# Patient Record
Sex: Female | Born: 1995 | Race: White | Hispanic: No | Marital: Single | Smoking: Current some day smoker
Health system: Southern US, Community
[De-identification: ages and names within clinical notes are randomized; demographics above are authoritative.]

## PROBLEM LIST (undated history)

## (undated) ENCOUNTER — Inpatient Hospital Stay (HOSPITAL_COMMUNITY): Payer: Self-pay

## (undated) ENCOUNTER — Inpatient Hospital Stay: Admission: EM | Payer: Self-pay | Source: Home / Self Care

## (undated) DIAGNOSIS — K8020 Calculus of gallbladder without cholecystitis without obstruction: Secondary | ICD-10-CM

## (undated) DIAGNOSIS — G8929 Other chronic pain: Secondary | ICD-10-CM

## (undated) DIAGNOSIS — M549 Dorsalgia, unspecified: Secondary | ICD-10-CM

## (undated) DIAGNOSIS — F419 Anxiety disorder, unspecified: Secondary | ICD-10-CM

## (undated) DIAGNOSIS — M5136 Other intervertebral disc degeneration, lumbar region: Secondary | ICD-10-CM

## (undated) DIAGNOSIS — M5430 Sciatica, unspecified side: Secondary | ICD-10-CM

## (undated) DIAGNOSIS — M51369 Other intervertebral disc degeneration, lumbar region without mention of lumbar back pain or lower extremity pain: Secondary | ICD-10-CM

## (undated) HISTORY — PX: CHOLECYSTECTOMY: SHX55

## (undated) HISTORY — PX: HERNIA REPAIR: SHX51

---
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
## 2013-04-14 ENCOUNTER — Encounter (HOSPITAL_BASED_OUTPATIENT_CLINIC_OR_DEPARTMENT_OTHER): Payer: Self-pay | Admitting: Emergency Medicine

## 2013-04-14 DIAGNOSIS — O9989 Other specified diseases and conditions complicating pregnancy, childbirth and the puerperium: Secondary | ICD-10-CM | POA: Insufficient documentation

## 2013-04-14 DIAGNOSIS — R110 Nausea: Secondary | ICD-10-CM | POA: Insufficient documentation

## 2013-04-14 DIAGNOSIS — O2390 Unspecified genitourinary tract infection in pregnancy, unspecified trimester: Secondary | ICD-10-CM | POA: Insufficient documentation

## 2013-04-14 DIAGNOSIS — Z9889 Other specified postprocedural states: Secondary | ICD-10-CM | POA: Insufficient documentation

## 2013-04-14 DIAGNOSIS — R42 Dizziness and giddiness: Secondary | ICD-10-CM | POA: Insufficient documentation

## 2013-04-14 DIAGNOSIS — N72 Inflammatory disease of cervix uteri: Secondary | ICD-10-CM | POA: Insufficient documentation

## 2013-04-14 DIAGNOSIS — N390 Urinary tract infection, site not specified: Secondary | ICD-10-CM | POA: Insufficient documentation

## 2013-04-14 DIAGNOSIS — Z8659 Personal history of other mental and behavioral disorders: Secondary | ICD-10-CM | POA: Insufficient documentation

## 2013-04-14 NOTE — ED Notes (Signed)
[redacted]weeks pregnant  Became dizzy and "collapsed"  Did not lose consciousness

## 2013-04-15 ENCOUNTER — Other Ambulatory Visit (HOSPITAL_BASED_OUTPATIENT_CLINIC_OR_DEPARTMENT_OTHER): Payer: Self-pay | Admitting: Emergency Medicine

## 2013-04-15 ENCOUNTER — Telehealth (HOSPITAL_BASED_OUTPATIENT_CLINIC_OR_DEPARTMENT_OTHER): Payer: Self-pay

## 2013-04-15 ENCOUNTER — Ambulatory Visit (HOSPITAL_BASED_OUTPATIENT_CLINIC_OR_DEPARTMENT_OTHER)
Admission: RE | Admit: 2013-04-15 | Discharge: 2013-04-15 | Disposition: A | Discharge status: Home / Self Care | Payer: Commercial Managed Care - PPO | Source: Ambulatory Visit | Attending: Emergency Medicine | Admitting: Emergency Medicine

## 2013-04-15 ENCOUNTER — Emergency Department (HOSPITAL_BASED_OUTPATIENT_CLINIC_OR_DEPARTMENT_OTHER)
Admission: EM | Admit: 2013-04-15 | Discharge: 2013-04-15 | Disposition: A | Discharge status: Home / Self Care | Payer: Medicaid Other | Attending: Emergency Medicine | Admitting: Emergency Medicine

## 2013-04-15 ENCOUNTER — Ambulatory Visit (HOSPITAL_BASED_OUTPATIENT_CLINIC_OR_DEPARTMENT_OTHER)
Admit: 2013-04-15 | Discharge: 2013-04-15 | Disposition: A | Discharge status: Home / Self Care | Payer: Commercial Managed Care - PPO | Attending: Emergency Medicine | Admitting: Emergency Medicine

## 2013-04-15 DIAGNOSIS — R109 Unspecified abdominal pain: Secondary | ICD-10-CM | POA: Insufficient documentation

## 2013-04-15 DIAGNOSIS — O9989 Other specified diseases and conditions complicating pregnancy, childbirth and the puerperium: Secondary | ICD-10-CM | POA: Insufficient documentation

## 2013-04-15 DIAGNOSIS — R42 Dizziness and giddiness: Secondary | ICD-10-CM | POA: Diagnosis not present

## 2013-04-15 DIAGNOSIS — O2341 Unspecified infection of urinary tract in pregnancy, first trimester: Secondary | ICD-10-CM

## 2013-04-15 DIAGNOSIS — R102 Pelvic and perineal pain: Secondary | ICD-10-CM

## 2013-04-15 DIAGNOSIS — R52 Pain, unspecified: Secondary | ICD-10-CM

## 2013-04-15 DIAGNOSIS — N72 Inflammatory disease of cervix uteri: Secondary | ICD-10-CM

## 2013-04-15 DIAGNOSIS — N949 Unspecified condition associated with female genital organs and menstrual cycle: Secondary | ICD-10-CM | POA: Diagnosis present

## 2013-04-15 LAB — URINE MICROSCOPIC-ADD ON

## 2013-04-15 LAB — ABO/RH: ABO/RH(D): A POS

## 2013-04-15 LAB — URINALYSIS, ROUTINE W REFLEX MICROSCOPIC
BILIRUBIN URINE: NEGATIVE
Glucose, UA: NEGATIVE mg/dL
KETONES UR: NEGATIVE mg/dL
NITRITE: NEGATIVE
PH: 6 (ref 5.0–8.0)
PROTEIN: NEGATIVE mg/dL
Specific Gravity, Urine: 1.017 (ref 1.005–1.030)
UROBILINOGEN UA: 0.2 mg/dL (ref 0.0–1.0)

## 2013-04-15 LAB — WET PREP, GENITAL
Trich, Wet Prep: NONE SEEN
Yeast Wet Prep HPF POC: NONE SEEN

## 2013-04-15 LAB — GC/CHLAMYDIA PROBE AMP
CT Probe RNA: NEGATIVE
GC PROBE AMP APTIMA: NEGATIVE

## 2013-04-15 LAB — HIV ANTIBODY (ROUTINE TESTING W REFLEX): HIV: NONREACTIVE

## 2013-04-15 LAB — HCG, QUANTITATIVE, PREGNANCY: hCG, Beta Chain, Quant, S: 102318 m[IU]/mL — ABNORMAL HIGH (ref <5)

## 2013-04-15 LAB — PREGNANCY, URINE: PREG TEST UR: POSITIVE — AB

## 2013-04-15 MED ORDER — CEFTRIAXONE SODIUM 250 MG IJ SOLR
250.0000 mg | Freq: Once | INTRAMUSCULAR | Status: AC
  Administered 2013-04-15: 250 mg via INTRAMUSCULAR
  Filled 2013-04-15: qty 250

## 2013-04-15 MED ORDER — NITROFURANTOIN MONOHYD MACRO 100 MG PO CAPS: 100.0000 mg | ORAL_CAPSULE | Freq: Two times a day (BID) | ORAL | Status: DC

## 2013-04-15 MED ORDER — NITROFURANTOIN MONOHYD MACRO 100 MG PO CAPS
100.0000 mg | ORAL_CAPSULE | Freq: Once | ORAL | Status: AC
  Administered 2013-04-15: 100 mg via ORAL
  Filled 2013-04-15: qty 1

## 2013-04-15 MED ORDER — AZITHROMYCIN 1 G PO PACK
1.0000 g | PACK | Freq: Once | ORAL | Status: AC
  Administered 2013-04-15: 1 g via ORAL
  Filled 2013-04-15: qty 1

## 2013-04-15 NOTE — ED Provider Notes (Signed)
CSN: [id number]     Arrival date & time 04/14/13  2335 History   First MD Initiated Contact with Patient 04/15/13 0324     Chief Complaint  Patient presents with  . Dizziness     (Consider location/radiation/quality/duration/timing/severity/associated sxs/prior Treatment) HPI This is a 18 year old female who is about [redacted] weeks pregnant. Yesterday evening about 10 PM she became nauseated, lightheaded and near syncopal. She became weak and slid to the ground. She has subsequently been having suprapubic discomfort which she describes as a cramping as well as low back pain. She has had some vaginal spotting as well as a tearing right suprapubic pain for the past week. The suprapubic discomfort is worse with movement or certain positions. She denies dysuria. She denies fever or chills. She has not been vomiting.  Past Medical History  Diagnosis Date  . Anxiety   . Anxiety    Past Surgical History  Procedure Laterality Date  . Hernia repair    . I&d extremity Right 12/18/2012    Procedure: IRRIGATION AND DEBRIDEMENT RIGHT RING FINGER;  Surgeon: Tami Ribas, MD;  Location: [facility] OR;  Service: Orthopedics;  Laterality: Right;   No family history on file. History  Substance Use Topics  . Smoking status: Never Smoker   . Smokeless tobacco: Not on file  . Alcohol Use: No   OB History   Grav Para Term Preterm Abortions TAB SAB Ect Mult Living                 Review of Systems  All other systems reviewed and are negative.    Allergies  Clindamycin/lincomycin and Pb-hyoscy-atropine-scopolamine  Home Medications  No current outpatient prescriptions on file. BP 130/82  Pulse 89  Temp(Src) 98 F (36.7 C) (Oral)  Resp 18  Ht 5\' 2"  (1.575 m)  Wt 170 lb (77.111 kg)  BMI 31.09 kg/m2  SpO2 99%  Physical Exam General: Well-developed, well-nourished female in no acute distress; appearance consistent with age of record HENT: normocephalic; atraumatic Eyes: pupils equal, round and  reactive to light; extraocular muscles intact Neck: supple Heart: regular rate and rhythm Lungs: clear to auscultation bilaterally Abdomen: soft; nondistended; diffuse tenderness; no masses or hepatosplenomegaly; bowel sounds present GU: Normal external genitalia; purulent discharge per cervical os; no vaginal bleeding; cervical motion tenderness; right adnexal tenderness Back: Low back tenderness Extremities: No deformity; full range of motion; pulses normal Neurologic: Awake, alert and oriented; motor function intact in all extremities and symmetric; no facial droop Skin: Warm and dry Psychiatric: Flat affect    ED Course  Procedures (including critical care time)   MDM   Nursing notes and vitals signs, including pulse oximetry, reviewed.  Summary of this visit's results, reviewed by myself:  Labs:  Results for orders placed during the hospital encounter of 04/15/13 (from the past 24 hour(s))  PREGNANCY, URINE     Status: Abnormal   Collection Time    04/15/13  1:05 AM      Result Value Ref Range   Preg Test, Ur POSITIVE (*) NEGATIVE  URINALYSIS, ROUTINE W REFLEX MICROSCOPIC     Status: Abnormal   Collection Time    04/15/13  1:05 AM      Result Value Ref Range   Color, Urine YELLOW  YELLOW   APPearance CLOUDY (*) CLEAR   Specific Gravity, Urine 1.017  1.005 - 1.030   pH 6.0  5.0 - 8.0   Glucose, UA NEGATIVE  NEGATIVE mg/dL   Hgb urine dipstick  MODERATE (*) NEGATIVE   Bilirubin Urine NEGATIVE  NEGATIVE   Ketones, ur NEGATIVE  NEGATIVE mg/dL   Protein, ur NEGATIVE  NEGATIVE mg/dL   Urobilinogen, UA 0.2  0.0 - 1.0 mg/dL   Nitrite NEGATIVE  NEGATIVE   Leukocytes, UA MODERATE (*) NEGATIVE  URINE MICROSCOPIC-ADD ON     Status: Abnormal   Collection Time    04/15/13  1:05 AM      Result Value Ref Range   Squamous Epithelial / LPF MANY (*) RARE   WBC, UA 7-10  <3 WBC/hpf   RBC / HPF 7-10  <3 RBC/hpf   Bacteria, UA MANY (*) RARE   Urine-Other MUCOUS PRESENT    WET  PREP, GENITAL     Status: Abnormal   Collection Time    04/15/13  3:47 AM      Result Value Ref Range   Yeast Wet Prep HPF POC NONE SEEN  NONE SEEN   Trich, Wet Prep NONE SEEN  NONE SEEN   Clue Cells Wet Prep HPF POC FEW (*) NONE SEEN   WBC, Wet Prep HPF POC TOO NUMEROUS TO COUNT (*) NONE SEEN   4:11 AM Ahead and treat for gonorrhea and Chlamydia given purulent cervical discharge. No evidence of trichomoniasis on urinalysis or wet prep. Will also treat for a urinary tract infection given a urinary findings. We will have her return later today for a pelvic ultrasound. She has followup scheduled with an OB/GYN in April. She was advised that [facility]related emergencies.    Hanley SeamenJohn L Mayukha Symmonds, MD 04/15/13 [phone]

## 2013-04-16 LAB — URINE CULTURE: Colony Count: 30000

## 2013-07-15 ENCOUNTER — Encounter (HOSPITAL_COMMUNITY): Payer: Self-pay | Admitting: *Deleted

## 2013-07-15 ENCOUNTER — Inpatient Hospital Stay (HOSPITAL_COMMUNITY)
Admission: AD | Admit: 2013-07-15 | Discharge: 2013-07-15 | Disposition: A | Discharge status: Home / Self Care | Payer: Commercial Managed Care - PPO | Source: Ambulatory Visit | Attending: Obstetrics & Gynecology | Admitting: Obstetrics & Gynecology

## 2013-07-15 DIAGNOSIS — R079 Chest pain, unspecified: Secondary | ICD-10-CM | POA: Diagnosis present

## 2013-07-15 DIAGNOSIS — O99340 Other mental disorders complicating pregnancy, unspecified trimester: Secondary | ICD-10-CM | POA: Insufficient documentation

## 2013-07-15 DIAGNOSIS — K219 Gastro-esophageal reflux disease without esophagitis: Secondary | ICD-10-CM | POA: Insufficient documentation

## 2013-07-15 DIAGNOSIS — F411 Generalized anxiety disorder: Secondary | ICD-10-CM | POA: Insufficient documentation

## 2013-07-15 DIAGNOSIS — O26892 Other specified pregnancy related conditions, second trimester: Secondary | ICD-10-CM

## 2013-07-15 DIAGNOSIS — R12 Heartburn: Secondary | ICD-10-CM

## 2013-07-15 DIAGNOSIS — O9989 Other specified diseases and conditions complicating pregnancy, childbirth and the puerperium: Secondary | ICD-10-CM

## 2013-07-15 MED ORDER — ALUM & MAG HYDROXIDE-SIMETH 200-200-20 MG/5ML PO SUSP
30.0000 mL | Freq: Once | ORAL | Status: AC
  Administered 2013-07-15: 30 mL via ORAL
  Filled 2013-07-15: qty 30

## 2013-07-15 MED ORDER — GI COCKTAIL ~~LOC~~: 30.0000 mL | Freq: Once | ORAL | Status: DC

## 2013-07-15 NOTE — MAU Note (Signed)
PT  SAY SHE STARTED HURTING AT  ON BOTH SIDES  OF CHEST  THEN RADIATES   TO MIDDLE OF ABD  THEN TO HER BACK.    0130-  SHE TOOK PEPCID.   NO N/V/ D.  IT ALSO HAPPENED  WHEN SHE WAS 14 WEEKS - GAVE PEPCID .    SAYS SHE HAS AN ANXIETY  DISORDER.  GET[facility]-  LAST SEEN -  LAST Thursday-  FOR [place].-   PLAN TO DELIVER THERE.

## 2013-07-15 NOTE — MAU Provider Note (Signed)
None     Chief Complaint: Anxiety/chest pain   Rachel Calhoun is  18 y.o. G1P0 at 6945w1d presents complaining of having an episode at home where she began feeling anxious and her chest started hurting/burning.  She took a pepcid and has "calmed down" and feels better now, although her chest still burns some.  [facility], but "doesn't really like them."  Ms Rachel Calhoun has suffered from anxiety and reflux since middle school.  According to her mother, she has had extensive workup (EKG's, stress test) to dx chest pain.  [name] drew the line at an EGD (didn't like the idea of a scope), and has been treating it as reflux ever since.  She was also started on Zoloft a few weeks before she found out she was pregnant for her anxiety, and stopped taking it "because she heard on TV that it was recalled."    Obstetrical/Gynecological History: OB History   Grav Para Term Preterm Abortions TAB SAB Ect Mult Living   1              Past Medical History: Past Medical History  Diagnosis Date  . Anxiety   . Anxiety     Past Surgical History: Past Surgical History  Procedure Laterality Date  . Hernia repair    . I&d extremity Right 12/18/2012    Procedure: IRRIGATION AND DEBRIDEMENT RIGHT RING FINGER;  Surgeon: Tami RibasKevin R Kuzma, MD;  Location: [facility] OR;  Service: Orthopedics;  Laterality: Right;    Family History: History reviewed. No pertinent family history.  Social History: History  Substance Use Topics  . Smoking status: Never Smoker   . Smokeless tobacco: Not on file  . Alcohol Use: No    Allergies:  Allergies  Allergen Reactions  . Clindamycin/Lincomycin     Uncontrollable fever  . Pb-Hyoscy-Atropine-Scopolamine     Uncontrollable fever     Meds:  No prescriptions prior to admission    Review of Systems   Constitutional: Negative for fever and chills Eyes: Negative for visual disturbances Respiratory: Negative for shortness of breath, dyspnea Cardiovascular:  Negative for palpitations  Gastrointestinal: Negative for vomiting, diarrhea and constipation Genitourinary: Negative for dysuria and urgency Musculoskeletal: Negative for joint pain, myalgias.  POSITIVE for upper back pain Neurological: Negative for dizziness and headaches    Physical Exam  Blood pressure 116/68, pulse 95, temperature 97.9 F (36.6 C), temperature source Oral, resp. rate 20, last menstrual period 11/19/2012, SpO2 99.00%. GENERAL: Well-developed, well-nourished female in no acute distress.  LUNGS: Clear to auscultation bilaterally. HEART: Regular rate and rhythm. ABDOMEN: Soft, nontender, nondistended, gravid.  EXTREMITIES: Nontender, no edema, 2+ distal pulses. DTR's 2+    Labs: No results found for this or any previous visit (from the past 24 hour(s)). EKG showed normal sinus rhythm  Assessment: Rachel Calhoun is  18 y.o. G1P0 at 5645w1d presents with anxiety/GERD, improved.  Plan: Offered viscous lidocaine (is allergic to donnatal in the GI cocktail) and declined.  Informed that zoloft had not been recalled and encouraged to restart.  Also encouraged to seek therapy/treatment for her anxiety. [facility] or transfer care  CRESENZO-DISHMAN,Rachel Calhoun 6/18/20159:12 PM

## 2013-07-15 NOTE — Discharge Instructions (Signed)
Gastroesophageal Reflux Disease, Adult  Gastroesophageal reflux disease (GERD) happens when acid from your stomach flows up into the esophagus. When acid comes in contact with the esophagus, the acid causes soreness (inflammation) in the esophagus. Over time, GERD may create small holes (ulcers) in the lining of the esophagus.  CAUSES   · Increased body weight. This puts pressure on the stomach, making acid rise from the stomach into the esophagus.  · Smoking. This increases acid production in the stomach.  · Drinking alcohol. This causes decreased pressure in the lower esophageal sphincter (valve or ring of muscle between the esophagus and stomach), allowing acid from the stomach into the esophagus.  · Late evening meals and a full stomach. This increases pressure and acid production in the stomach.  · A malformed lower esophageal sphincter.  Sometimes, no cause is found.  SYMPTOMS   · Burning pain in the lower part of the mid-chest behind the breastbone and in the mid-stomach area. This may occur twice a week or more often.  · Trouble swallowing.  · Sore throat.  · Dry cough.  · Asthma-like symptoms including chest tightness, shortness of breath, or wheezing.  DIAGNOSIS   Your caregiver may be able to diagnose GERD based on your symptoms. In some cases, X-rays and other tests may be done to check for complications or to check the condition of your stomach and esophagus.  TREATMENT   Your caregiver may recommend over-the-counter or prescription medicines to help decrease acid production. Ask your caregiver before starting or adding any new medicines.   HOME CARE INSTRUCTIONS   · Change the factors that you can control. Ask your caregiver for guidance concerning weight loss, quitting smoking, and alcohol consumption.  · Avoid foods and drinks that make your symptoms worse, such as:  · Caffeine or alcoholic drinks.  · Chocolate.  · Peppermint or mint flavorings.  · Garlic and onions.  · Spicy foods.  · Citrus fruits,  such as oranges, lemons, or limes.  · Tomato-based foods such as sauce, chili, salsa, and pizza.  · Fried and fatty foods.  · Avoid lying down for the 3 hours prior to your bedtime or prior to taking a nap.  · Eat small, frequent meals instead of large meals.  · Wear loose-fitting clothing. Do not wear anything tight around your waist that causes pressure on your stomach.  · Raise the head of your bed 6 to 8 inches with wood blocks to help you sleep. Extra pillows will not help.  · Only take over-the-counter or prescription medicines for pain, discomfort, or fever as directed by your caregiver.  · Do not take aspirin, ibuprofen, or other nonsteroidal anti-inflammatory drugs (NSAIDs).  SEEK IMMEDIATE MEDICAL CARE IF:   · You have pain in your arms, neck, jaw, teeth, or back.  · Your pain increases or changes in intensity or duration.  · You develop nausea, vomiting, or sweating (diaphoresis).  · You develop shortness of breath, or you faint.  · Your vomit is green, yellow, black, or looks like coffee grounds or blood.  · Your stool is red, bloody, or black.  These symptoms could be signs of other problems, such as heart disease, gastric bleeding, or esophageal bleeding.  MAKE SURE YOU:   · Understand these instructions.  · Will watch your condition.  · Will get help right away if you are not doing well or get worse.  Document Released: 10/30/2004 Document Revised: 04/14/2011 Document Reviewed: 08/09/2010  [employer]® Patient   Information ©2014 [employer].

## 2013-07-25 NOTE — MAU Provider Note (Signed)
Attestation of Attending Supervision of Advanced Practitioner (CNM/NP): Evaluation and management procedures were performed by the Advanced Practitioner under my supervision and collaboration. I have reviewed the Advanced Practitioner's note and chart, and I agree with the management and plan.  Rachel Wandrey H. 2:51 PM

## 2013-09-15 ENCOUNTER — Inpatient Hospital Stay (HOSPITAL_COMMUNITY)
Admission: AD | Admit: 2013-09-15 | Discharge: 2013-09-15 | Disposition: A | Discharge status: Home / Self Care | Payer: Medicaid Other | Source: Ambulatory Visit | Attending: Obstetrics & Gynecology | Admitting: Obstetrics & Gynecology

## 2013-09-15 ENCOUNTER — Encounter (HOSPITAL_COMMUNITY): Payer: Self-pay | Admitting: *Deleted

## 2013-09-15 DIAGNOSIS — K8050 Calculus of bile duct without cholangitis or cholecystitis without obstruction: Secondary | ICD-10-CM

## 2013-09-15 DIAGNOSIS — K8020 Calculus of gallbladder without cholecystitis without obstruction: Secondary | ICD-10-CM | POA: Insufficient documentation

## 2013-09-15 DIAGNOSIS — O9989 Other specified diseases and conditions complicating pregnancy, childbirth and the puerperium: Secondary | ICD-10-CM | POA: Insufficient documentation

## 2013-09-15 DIAGNOSIS — R109 Unspecified abdominal pain: Secondary | ICD-10-CM | POA: Diagnosis present

## 2013-09-15 HISTORY — DX: Calculus of gallbladder without cholecystitis without obstruction: K80.20

## 2013-09-15 LAB — URINALYSIS, ROUTINE W REFLEX MICROSCOPIC
Bilirubin Urine: NEGATIVE
GLUCOSE, UA: NEGATIVE mg/dL
HGB URINE DIPSTICK: NEGATIVE
Ketones, ur: 15 mg/dL — AB
Nitrite: NEGATIVE
PROTEIN: NEGATIVE mg/dL
Specific Gravity, Urine: 1.015 (ref 1.005–1.030)
UROBILINOGEN UA: 0.2 mg/dL (ref 0.0–1.0)
pH: 6.5 (ref 5.0–8.0)

## 2013-09-15 LAB — COMPREHENSIVE METABOLIC PANEL
ALT: 12 U/L (ref 0–35)
ANION GAP: 12 (ref 5–15)
AST: 11 U/L (ref 0–37)
Albumin: 2.9 g/dL — ABNORMAL LOW (ref 3.5–5.2)
Alkaline Phosphatase: 97 U/L (ref 39–117)
BUN: 7 mg/dL (ref 6–23)
CO2: 22 mEq/L (ref 19–32)
CREATININE: 0.46 mg/dL — AB (ref 0.50–1.10)
Calcium: 8.7 mg/dL (ref 8.4–10.5)
Chloride: 103 mEq/L (ref 96–112)
GFR calc Af Amer: >90 mL/min (ref >90)
Glucose, Bld: 84 mg/dL (ref 70–99)
Potassium: 3.8 mEq/L (ref 3.7–5.3)
SODIUM: 137 meq/L (ref 137–147)
TOTAL PROTEIN: 6.1 g/dL (ref 6.0–8.3)
Total Bilirubin: 0.6 mg/dL (ref 0.3–1.2)

## 2013-09-15 LAB — CBC WITH DIFFERENTIAL/PLATELET
Basophils Absolute: 0 10*3/uL (ref 0.0–0.1)
Basophils Relative: 0 % (ref 0–1)
EOS ABS: 0.1 10*3/uL (ref 0.0–0.7)
Eosinophils Relative: 1 % (ref 0–5)
HEMATOCRIT: 32 % — AB (ref 36.0–46.0)
Hemoglobin: 10.8 g/dL — ABNORMAL LOW (ref 12.0–15.0)
Lymphocytes Relative: 21 % (ref 12–46)
Lymphs Abs: 2.3 10*3/uL (ref 0.7–4.0)
MCH: 27.8 pg (ref 26.0–34.0)
MCHC: 33.8 g/dL (ref 30.0–36.0)
MCV: 82.5 fL (ref 78.0–100.0)
MONO ABS: 0.9 10*3/uL (ref 0.1–1.0)
Monocytes Relative: 8 % (ref 3–12)
Neutro Abs: 7.8 10*3/uL — ABNORMAL HIGH (ref 1.7–7.7)
Neutrophils Relative %: 70 % (ref 43–77)
PLATELETS: 202 10*3/uL (ref 150–400)
RBC: 3.88 MIL/uL (ref 3.87–5.11)
RDW: 13.2 % (ref 11.5–15.5)
WBC: 11.2 10*3/uL — ABNORMAL HIGH (ref 4.0–10.5)

## 2013-09-15 LAB — URINE MICROSCOPIC-ADD ON

## 2013-09-15 MED ORDER — CYCLOBENZAPRINE HCL 5 MG PO TABS: 5.0000 mg | ORAL_TABLET | Freq: Three times a day (TID) | ORAL | Status: DC | PRN

## 2013-09-15 MED ORDER — ONDANSETRON HCL 4 MG PO TABS
4.0000 mg | ORAL_TABLET | Freq: Once | ORAL | Status: AC
  Administered 2013-09-15: 4 mg via ORAL
  Filled 2013-09-15: qty 1

## 2013-09-15 MED ORDER — HYDROMORPHONE HCL 2 MG PO TABS
2.0000 mg | ORAL_TABLET | Freq: Once | ORAL | Status: AC
  Administered 2013-09-15: 2 mg via ORAL
  Filled 2013-09-15: qty 1

## 2013-09-15 NOTE — MAU Note (Signed)
Pt reports she just found out she has gallstones and has been seen 2  Times resently, for the last 2 hours has been having rt side pain, nausea.

## 2013-09-15 NOTE — Discharge Instructions (Signed)
Abdominal Pain °Many things can cause abdominal pain. Usually, abdominal pain is not caused by a disease and will improve without treatment. It can often be observed and treated at home. Your health care provider will do a physical exam and possibly order blood tests and X-rays to help determine the seriousness of your pain. However, in many cases, more time must pass before a clear cause of the pain can be found. Before that point, your health care provider may not know if you need more testing or further treatment. °HOME CARE INSTRUCTIONS  °Monitor your abdominal pain for any changes. The following actions may help to alleviate any discomfort you are experiencing: °· Only take over-the-counter or prescription medicines as directed by your health care provider. °· Do not take laxatives unless directed to do so by your health care provider. °· Try a clear liquid diet (broth, tea, or water) as directed by your health care provider. Slowly move to a bland diet as tolerated. °SEEK MEDICAL CARE IF: °· You have unexplained abdominal pain. °· You have abdominal pain associated with nausea or diarrhea. °· You have pain when you urinate or have a bowel movement. °· You experience abdominal pain that wakes you in the night. °· You have abdominal pain that is worsened or improved by eating food. °· You have abdominal pain that is worsened with eating fatty foods. °· You have a fever. °SEEK IMMEDIATE MEDICAL CARE IF:  °· Your pain does not go away within 2 hours. °· You keep throwing up (vomiting). °· Your pain is felt only in portions of the abdomen, such as the right side or the left lower portion of the abdomen. °· You pass bloody or black tarry stools. °MAKE SURE YOU: °· Understand these instructions.   °· Will watch your condition.   °· Will get help right away if you are not doing well or get worse.   °Document Released: 10/30/2004 Document Revised: 01/25/2013 Document Reviewed: 09/29/2012 °[employer]® Patient Information  ©2015 [employer]. This information is not intended to replace advice given to you by your health care provider. Make sure you discuss any questions you have with your health care provider. ° °Biliary Colic  °Biliary colic is a steady or irregular pain in the upper abdomen. It is usually under the right side of the rib cage. It happens when gallstones interfere with the normal flow of bile from the gallbladder. Bile is a liquid that helps to digest fats. Bile is made in the liver and stored in the gallbladder. When you eat a meal, bile passes from the gallbladder through the cystic duct and the common bile duct into the small intestine. There, it mixes with partially digested food. If a gallstone blocks either of these ducts, the normal flow of bile is blocked. The muscle cells in the bile duct contract forcefully to try to move the stone. This causes the pain of biliary colic.  °SYMPTOMS  °· A person with biliary colic usually complains of pain in the upper abdomen. This pain can be: °¨ In the center of the upper abdomen just below the breastbone. °¨ In the upper-right part of the abdomen, near the gallbladder and liver. °¨ Spread back toward the right shoulder blade. °· Nausea and vomiting. °· The pain usually occurs after eating. °· Biliary colic is usually triggered by the digestive system's demand for bile. The demand for bile is high after fatty meals. Symptoms can also occur when a person who has been fasting suddenly eats a very   large meal. Most episodes of biliary colic pass after 1 to 5 hours. After the most intense pain passes, your abdomen may continue to ache mildly for about 24 hours. DIAGNOSIS  After you describe your symptoms, your caregiver will perform a physical exam. He or she will pay attention to the upper right portion of your belly (abdomen). This is the area of your liver and gallbladder. An ultrasound will help your caregiver look for gallstones. Specialized scans of the gallbladder may  also be done. Blood tests may be done, especially if you have fever or if your pain persists. PREVENTION  Biliary colic can be prevented by controlling the risk factors for gallstones. Some of these risk factors, such as heredity, increasing age, and pregnancy are a normal part of life. Obesity and a high-fat diet are risk factors you can change through a healthy lifestyle. Women going through menopause who take hormone replacement therapy (estrogen) are also more likely to develop biliary colic. TREATMENT   Pain medication may be prescribed.  You may be encouraged to eat a fat-free diet.  If the first episode of biliary colic is severe, or episodes of colic keep retuning, surgery to remove the gallbladder (cholecystectomy) is usually recommended. This procedure can be done through small incisions using an instrument called a laparoscope. The procedure often requires a brief stay in the hospital. Some people can leave the hospital the same day. It is the most widely used treatment in people troubled by painful gallstones. It is effective and safe, with no complications in more than 90% of cases.  If surgery cannot be done, medication that dissolves gallstones may be used. This medication is expensive and can take months or years to work. Only small stones will dissolve.  Rarely, medication to dissolve gallstones is combined with a procedure called shock-wave lithotripsy. This procedure uses carefully aimed shock waves to break up gallstones. In many people treated with this procedure, gallstones form again within a few years. PROGNOSIS  If gallstones block your cystic duct or common bile duct, you are at risk for repeated episodes of biliary colic. There is also a 25% chance that you will develop a gallbladder infection(acute cholecystitis), or some other complication of gallstones within 10 to 20 years. If you have surgery, schedule it at a time that is convenient for you and at a time when you are  not sick. HOME CARE INSTRUCTIONS   Drink plenty of clear fluids.  Avoid fatty, greasy or fried foods, or any foods that make your pain worse.  Take medications as directed. SEEK MEDICAL CARE IF:   You develop a fever over 100.5 F (38.1 C).  Your pain gets worse over time.  You develop nausea that prevents you from eating and drinking.  You develop vomiting. SEEK IMMEDIATE MEDICAL CARE IF:   You have continuous or severe belly (abdominal) pain which is not relieved with medications.  You develop nausea and vomiting which is not relieved with medications.  You have symptoms of biliary colic and you suddenly develop a fever and shaking chills. This may signal cholecystitis. Call your caregiver immediately.  You develop a yellow color to your skin or the white part of your eyes (jaundice). Document Released: 06/23/2005 Document Revised: 04/14/2011 Document Reviewed: 09/02/2007 [facility] Patient Information 2015 [place], [place]. This information is not intended to replace advice given to you by your health care provider. Make sure you discuss any questions you have with your health care provider.  Cholecystitis  Cholecystitis is  swelling and irritation (inflammation) of your gallbladder. This often happens when gallstones or sludge build up in the gallbladder. Treatment is needed right away. HOME CARE Home care depends on how you were treated. In general:  If you were given antibiotic medicine, take it as told. Finish the medicine even if you start to feel better.  Only take medicines as told by your doctor.  Eat low-fat foods until your next doctor visit.  Keep all doctor visits as told. GET HELP RIGHT AWAY IF:  You have more pain and medicine does not help.  Your pain moves to a different part of your belly (abdomen) or to your back.  You have a fever.  You feel sick to your stomach (nauseous).  You throw up (vomit). MAKE SURE YOU:  Understand these  instructions.  Will watch your condition.  Will get help right away if you are not doing well or get worse. Document Released: 01/09/2011 Document Revised: 04/14/2011 Document Reviewed: 01/09/2011 [facility] Patient Information 2015 [place]. This information is not intended to replace advice given to you by your health care provider. Make sure you discuss any questions you have with your health care provider. Third Trimester of Pregnancy The third trimester is from week 29 through week 42, months 7 through 9. This trimester is when your unborn baby (fetus) is growing very fast. At the end of the ninth month, the unborn baby is about 20 inches in length. It weighs about 6-10 pounds.  HOME CARE   Avoid all smoking, herbs, and alcohol. Avoid drugs not approved by your doctor.  Only take medicine as told by your doctor. Some medicines are safe and some are not during pregnancy.  Exercise only as told by your doctor. Stop exercising if you start having cramps.  Eat regular, healthy meals.  Wear a good support bra if your breasts are tender.  Do not use hot tubs, steam rooms, or saunas.  Wear your seat belt when driving.  Avoid raw meat, uncooked cheese, and liter boxes and soil used by cats.  Take your prenatal vitamins.  Try taking medicine that helps you poop (stool softener) as needed, and if your doctor approves. Eat more fiber by eating fresh fruit, vegetables, and whole grains. Drink enough fluids to keep your pee (urine) clear or pale yellow.  Take warm water baths (sitz baths) to soothe pain or discomfort caused by hemorrhoids. Use hemorrhoid cream if your doctor approves.  If you have puffy, bulging veins (varicose veins), wear support hose. Raise (elevate) your feet for 15 minutes, 3-4 times a day. Limit salt in your diet.  Avoid heavy lifting, wear low heels, and sit up straight.  Rest with your legs raised if you have leg cramps or low back pain.  Visit your  dentist if you have not gone during your pregnancy. Use a soft toothbrush to brush your teeth. Be gentle when you floss.  You can have sex (intercourse) unless your doctor tells you not to.  Do not travel far distances unless you must. Only do so with your doctor's approval.  Take prenatal classes.  Practice driving to the hospital.  Pack your hospital bag.  Prepare the baby's room.  Go to your doctor visits. GET HELP IF:  You are not sure if you are in labor or if your water has broken.  You are dizzy.  You have mild cramps or pressure in your lower belly (abdominal).  You have a nagging pain in your belly area.  You continue to feel sick to your stomach (nauseous), throw up (vomit), or have watery poop (diarrhea).  You have bad smelling fluid coming from your vagina.  You have pain with peeing (urination). GET HELP RIGHT AWAY IF:   You have a fever.  You are leaking fluid from your vagina.  You are spotting or bleeding from your vagina.  You have severe belly cramping or pain.  You lose or gain weight rapidly.  You have trouble catching your breath and have chest pain.  You notice sudden or extreme puffiness (swelling) of your face, hands, ankles, feet, or legs.  You have not felt the baby move in over an hour.  You have severe headaches that do not go away with medicine.  You have vision changes. Document Released: 04/16/2009 Document Revised: 05/17/2012 Document Reviewed: 03/23/2012 [facility] Patient Information 2015 [place], [place]. This information is not intended to replace advice given to you by your health care provider. Make sure you discuss any questions you have with your health care provider.

## 2013-09-15 NOTE — MAU Provider Note (Signed)
First Provider Initiated Contact with Patient 09/15/13 [phone]      Chief Complaint:  Abdominal Pain   Rachel Calhoun is  18 y.o. G1P0 at [redacted]w[redacted]d presents complaining of Abdominal Pain .  She states none contractions are associated with none vaginal bleeding, intact membranes, along with active fetal movement. Pt. Presents this evening with onset of crampy right sided abdominal pain around 0130. She says that she has a history of diagnosed cholelithiasis that was found upon ultrasound at [facility] in 08/2013. She says that she has had pain in this similar nature / pattern before that has been attributed to her gallstones. She says, however, that this pain is much less severe (4/10), but that she was told she should come to get the pain evaluated if she were to have it again. She says that her last po intake was some grapes that she ate around 11:30 pm 8/12. She denies nausea, vomiting, fever, chills, dysuria, hematuria, contractions. She says that the pain radiates to her epigastrum and is constant. She says that she has been treated with antibiotics and pain control in the past for her symptoms. She denies complications with this pregnancy, and she denies vaginal bleeding, LOF. She has +FM. She has no other complaints at this time.   Obstetrical/Gynecological History: OB History   Grav Para Term Preterm Abortions TAB SAB Ect Mult Living   1              Past Medical History: Past Medical History  Diagnosis Date  . Anxiety   . Anxiety   . Gallstones     Past Surgical History: Past Surgical History  Procedure Laterality Date  . Hernia repair    . I&d extremity Right 12/18/2012    Procedure: IRRIGATION AND DEBRIDEMENT RIGHT RING FINGER;  Surgeon: Tami Ribas, MD;  Location: [facility] OR;  Service: Orthopedics;  Laterality: Right;    Family History: History reviewed. No pertinent family history.  Social History: History  Substance Use Topics  . Smoking status: Never Smoker    . Smokeless tobacco: Not on file  . Alcohol Use: No    Allergies:  Allergies  Allergen Reactions  . Clindamycin/Lincomycin     Uncontrollable fever  . Pb-Hyoscy-Atropine-Scopolamine     Uncontrollable fever     Meds:  Prescriptions prior to admission  Medication Sig Dispense Refill  . Multiple Vitamin (MULTIVITAMIN) tablet Take 1 tablet by mouth daily.      . nitrofurantoin, macrocrystal-monohydrate, (MACROBID) 100 MG capsule Take 1 capsule (100 mg total) by mouth 2 (two) times daily. X 7 days  14 capsule  0    Review of Systems -  Per HPI above.    Physical Exam  Blood pressure 114/68, pulse 94, temperature 98.4 F (36.9 C), temperature source Oral, resp. rate 18, height 5\' 2"  (1.575 m), weight 95.255 kg (210 lb), last menstrual period 11/19/2012, SpO2 98.00%. GENERAL: Well-developed, well-nourished female in no acute distress.  LUNGS: Clear to auscultation bilaterally.  HEART: Regular rate and rhythm. ABDOMEN: Soft, nontender, nondistended, gravid.  EXTREMITIES: Nontender, no edema, 2+ distal pulses. Grossly neurologically intact.  CERVICAL EXAM: Deferred at this time.  FHT:  Baseline rate 125 bpm   Variability moderate  Accelerations present   Decelerations none Contractions: None   Labs: No results found for this or any previous visit (from the past 24 hour(s)). Imaging Studies:  No results found.  Assessment: Rachel Calhoun is  18 y.o. G1P0 at [redacted]w[redacted]d presents  with biliary colic.  Plan: 1. Biliary Colic - [facility]  - Symptom control for management at this time. Source for pain is likely biliary in nature given the similarity of the character of this pain to her previous episodes. No plan for cholecystectomy until postpartum.  - CBC within normal limits, T-bili 0.6, all other LFT's within normal limits. U/A Normal.  - Dilaudid for pain control at this point. Will discharge with flexaril for mild pain at home with  instruction to return if her pain continues to worsen, she begins to experience nausea / vomiting, or for any other concern.  - PO hydration - Patient instructed to return for worsening symptoms, or for any other concern.   Thanks for letting us take care of you!   [place], Hillery HunterCaleb G 8/13/20155:20 AM  I have participated in the care of this patient and I agree with the above. Cam HaiSHAW, Cordella Nyquist CNM 12:07 AM 09/17/2013

## 2013-12-05 ENCOUNTER — Encounter (HOSPITAL_COMMUNITY): Payer: Self-pay | Admitting: *Deleted

## 2014-05-20 ENCOUNTER — Encounter (HOSPITAL_COMMUNITY): Payer: Self-pay | Admitting: *Deleted

## 2014-07-26 ENCOUNTER — Emergency Department (HOSPITAL_BASED_OUTPATIENT_CLINIC_OR_DEPARTMENT_OTHER)
Admission: EM | Admit: 2014-07-26 | Discharge: 2014-07-26 | Disposition: A | Discharge status: Home / Self Care | Payer: Worker's Compensation | Attending: Emergency Medicine | Admitting: Emergency Medicine

## 2014-07-26 ENCOUNTER — Encounter (HOSPITAL_BASED_OUTPATIENT_CLINIC_OR_DEPARTMENT_OTHER): Payer: Self-pay | Admitting: Emergency Medicine

## 2014-07-26 ENCOUNTER — Emergency Department (HOSPITAL_BASED_OUTPATIENT_CLINIC_OR_DEPARTMENT_OTHER): Payer: Worker's Compensation

## 2014-07-26 DIAGNOSIS — S4991XA Unspecified injury of right shoulder and upper arm, initial encounter: Secondary | ICD-10-CM | POA: Diagnosis not present

## 2014-07-26 DIAGNOSIS — X58XXXA Exposure to other specified factors, initial encounter: Secondary | ICD-10-CM | POA: Insufficient documentation

## 2014-07-26 DIAGNOSIS — Z8719 Personal history of other diseases of the digestive system: Secondary | ICD-10-CM | POA: Insufficient documentation

## 2014-07-26 DIAGNOSIS — Y9289 Other specified places as the place of occurrence of the external cause: Secondary | ICD-10-CM | POA: Diagnosis not present

## 2014-07-26 DIAGNOSIS — Y9389 Activity, other specified: Secondary | ICD-10-CM | POA: Diagnosis not present

## 2014-07-26 DIAGNOSIS — Z8659 Personal history of other mental and behavioral disorders: Secondary | ICD-10-CM | POA: Insufficient documentation

## 2014-07-26 DIAGNOSIS — Z79899 Other long term (current) drug therapy: Secondary | ICD-10-CM | POA: Diagnosis not present

## 2014-07-26 DIAGNOSIS — Y990 Civilian activity done for income or pay: Secondary | ICD-10-CM | POA: Diagnosis not present

## 2014-07-26 MED ORDER — HYDROCODONE-ACETAMINOPHEN 5-325 MG PO TABS: 1.0000 | ORAL_TABLET | Freq: Four times a day (QID) | ORAL | Status: DC | PRN

## 2014-07-26 NOTE — ED Notes (Signed)
Right shoulder pain after hearing a pop  While moving a box

## 2014-07-26 NOTE — ED Provider Notes (Signed)
CSN: [id number]     Arrival date & time 07/26/14  9147 History   First MD Initiated Contact with Patient 07/26/14 [phone]     Chief Complaint  Patient presents with  . Shoulder Injury     (Consider location/radiation/quality/duration/timing/severity/associated sxs/prior Treatment) HPI  This is a 19 year old female who was trying to move a box off the conveyor belt at work this morning about 1:30 AM. She pushed against the box with her right arm with full strength and she felt a pop in her right shoulder. She is now having pain in the right shoulder. The pain is somewhat generalized but is most prominent at the before meals joint. She is having difficulty moving her right shoulder, notably abduction. There is no numbness or weakness in the right upper extremity distally. There is no associated deformity. She denies other injury. She rates her pain as a 5-6 out of 10  Past Medical History  Diagnosis Date  . Anxiety   . Gallstones    Past Surgical History  Procedure Laterality Date  . Hernia repair    . I&d extremity Right 12/18/2012    Procedure: IRRIGATION AND DEBRIDEMENT RIGHT RING FINGER;  Surgeon: Tami Ribas, MD;  Location: [facility] OR;  Service: Orthopedics;  Laterality: Right;  . Cholecystectomy     History reviewed. No pertinent family history. History  Substance Use Topics  . Smoking status: Never Smoker   . Smokeless tobacco: Not on file  . Alcohol Use: No   OB History    Gravida Para Term Preterm AB TAB SAB Ectopic Multiple Living   1              Review of Systems  All other systems reviewed and are negative.   Allergies  Clindamycin/lincomycin and Pb-hyoscy-atropine-scopolamine  Home Medications   Prior to Admission medications   Medication Sig Start Date End Date Taking? Authorizing Provider  cyclobenzaprine (FLEXERIL) 5 MG tablet Take 1 tablet (5 mg total) by mouth 3 (three) times daily as needed for muscle spasms. 09/15/13   Yolande Jolly, MD  Multiple  Vitamin (MULTIVITAMIN) tablet Take 1 tablet by mouth daily.    Historical Provider, MD  nitrofurantoin, macrocrystal-monohydrate, (MACROBID) 100 MG capsule Take 1 capsule (100 mg total) by mouth 2 (two) times daily. X 7 days 04/15/13   Paula Libra, MD   BP 118/83 mmHg  Pulse 73  Temp(Src) 99.4 F (37.4 C) (Oral)  Resp 12  SpO2 100%  LMP 07/25/2014   Physical Exam  General: Well-developed, well-nourished female in no acute distress; appearance consistent with age of record HENT: normocephalic; atraumatic Eyes: pupils equal, round and reactive to light; extraocular muscles intact Neck: supple Heart: regular rate and rhythm Lungs: clear to auscultation bilaterally Abdomen: soft; nondistended; nontender; bowel sounds present Extremities: No deformity; full range of motion except right shoulder; pulses normal; tenderness of right shoulder most notably at the before meals joint without appreciable step off, right upper extremity distally neurovascularly intact Neurologic: Awake, alert and oriented; motor function intact in all extremities and symmetric; no facial droop Skin: Warm and dry Psychiatric: Normal mood and affect    ED Course  Procedures (including critical care time)   MDM  Nursing notes and vitals signs, including pulse oximetry, reviewed.  Summary of this visit's results, reviewed by myself:  Imaging Studies: Dg Shoulder Right  07/26/2014   CLINICAL DATA:  Right shoulder pain after hearing pop while lifting box at work. Initial encounter.  EXAM: RIGHT SHOULDER -  2+ VIEW  COMPARISON:  None.  FINDINGS: There is no evidence of fracture or dislocation. The right humeral head is seated within the glenoid fossa. The acromioclavicular joint is unremarkable in appearance. No significant soft tissue abnormalities are seen. The visualized portions of the right lung are clear.  IMPRESSION: No evidence of fracture or dislocation.   Electronically Signed   By: Roanna Raider M.D.   On:  07/26/2014 05:14       Paula Libra, MD 07/26/14 [phone]

## 2014-07-26 NOTE — ED Notes (Signed)
MD at bedside. 

## 2014-07-26 NOTE — Discharge Instructions (Signed)
Acromioclavicular Injuries °The AC (acromioclavicular) joint is the joint in the shoulder where the collarbone (clavicle) meets the shoulder blade (scapula). The part of the shoulder blade connected to the collarbone is called the acromion. Common problems with and treatments for the AC joint are detailed below. °ARTHRITIS °Arthritis occurs when the joint has been injured and the smooth padding between the joints (cartilage) is lost. This is the wear and tear seen in most joints of the body if they have been overused. This causes the joint to produce pain and swelling which is worse with activity.  °AC JOINT SEPARATION °AC joint separation means that the ligaments connecting the acromion of the shoulder blade and collarbone have been damaged, and the two bones no longer line up. AC separations can be anywhere from mild to severe, and are "graded" depending upon which ligaments are torn and how badly they are torn. °· Grade I Injury: the least damage is done, and the AC joint still lines up. °· Grade II Injury: damage to the ligaments which reinforce the AC joint. In a Grade II injury, these ligaments are stretched but not entirely torn. When stressed, the AC joint becomes painful and unstable. °· Grade III Injury: AC and secondary ligaments are completely torn, and the collarbone is no longer attached to the shoulder blade. This results in deformity; a prominence of the end of the clavicle. °AC JOINT FRACTURE °AC joint fracture means that there has been a break in the bones of the AC joint, usually the end of the clavicle. °TREATMENT °TREATMENT OF AC ARTHRITIS °· There is currently no way to replace the cartilage damaged by arthritis. The best way to improve the condition is to decrease the activities which aggravate the problem. Application of ice to the joint helps decrease pain and soreness (inflammation). The use of non-steroidal anti-inflammatory medication is helpful. °· If less conservative measures do not  work, then cortisone shots (injections) may be used. These are anti-inflammatories; they decrease the soreness in the joint and swelling. °· If non-surgical measures fail, surgery may be recommended. The procedure is generally removal of a portion of the end of the clavicle. This is the part of the collarbone closest to your acromion which is stabilized with ligaments to the acromion of the shoulder blade. This surgery may be performed using a tube-like instrument with a light (arthroscope) for looking into a joint. It may also be performed as an open surgery through a small incision by the surgeon. Most patients will have good range of motion within 6 weeks and may return to all activity including sports by 8-12 weeks, barring complications. °TREATMENT OF AN AC SEPARATION °· The initial treatment is to decrease pain. This is best accomplished by immobilizing the arm in a sling and placing an ice pack to the shoulder for 20 to 30 minutes every 2 hours as needed. As the pain starts to subside, it is important to begin moving the fingers, wrist, elbow and eventually the shoulder in order to prevent a stiff or "frozen" shoulder. Instruction on when and how much to move the shoulder will be provided by your caregiver. The length of time needed to regain full motion and function depends on the amount or grade of the injury. Recovery from a Grade I AC separation usually takes 10 to 14 days, whereas a Grade III may take 6 to 8 weeks. °· Grade I and II separations usually do not require surgery. Even Grade III injuries usually allow return to full   activity with few restrictions. Treatment is also based on the activity demands of the injured shoulder. For example, a high level quarterback with an injured throwing arm will receive more aggressive treatment than someone with a desk job who rarely uses his/her arm for strenuous activities. In some cases, a painful lump may persist which could require a later surgery. Surgery  can be very successful, but the benefits must be weighed against the potential risks. °TREATMENT OF AN AC JOINT FRACTURE °Fracture treatment depends on the type of fracture. Sometimes a splint or sling may be all that is required. Other times surgery may be required for repair. This is more frequently the case when the ligaments supporting the clavicle are completely torn. Your caregiver will help you with these decisions and together you can decide what will be the best treatment. °HOME CARE INSTRUCTIONS  °· Apply ice to the injury for 15-20 minutes each hour while awake for 2 days. Put the ice in a plastic bag and place a towel between the bag of ice and skin. °· If a sling has been applied, wear it constantly for as long as directed by your caregiver, even at night. The sling or splint can be removed for bathing or showering or as directed. Be sure to keep the shoulder in the same place as when the sling is on. Do not lift the arm. °· If a figure-of-eight splint has been applied it should be tightened gently by another person every day. Tighten it enough to keep the shoulders held back. Allow enough room to place the index finger between the body and strap. Loosen the splint immediately if there is numbness or tingling in the hands. °· Take over-the-counter or prescription medicines for pain, discomfort or fever as directed by your caregiver. °· If you or your child has received a follow up appointment, it is very important to keep that appointment in order to avoid long term complications, chronic pain or disability. °SEEK MEDICAL CARE IF:  °· The pain is not relieved with medications. °· There is increased swelling or discoloration that continues to get worse rather than better. °· You or your child has been unable to follow up as instructed. °· There is progressive numbness and tingling in the arm, forearm or hand. °SEEK IMMEDIATE MEDICAL CARE IF:  °· The arm is numb, cold or pale. °· There is increasing pain  in the hand, forearm or fingers. °MAKE SURE YOU:  °· Understand these instructions. °· Will watch your condition. °· Will get help right away if you are not doing well or get worse. °Document Released: 10/30/2004 Document Revised: 04/14/2011 Document Reviewed: 04/24/2008 °Exit[employer]® Patient Information ©2015 [employer]. This information is not intended to replace advice given to you by your health care provider. Make sure you discuss any questions you have with your health care provider. ° °

## 2014-08-23 ENCOUNTER — Encounter (HOSPITAL_COMMUNITY): Payer: Self-pay | Admitting: Emergency Medicine

## 2014-08-23 ENCOUNTER — Emergency Department (HOSPITAL_COMMUNITY)
Admission: EM | Admit: 2014-08-23 | Discharge: 2014-08-24 | Disposition: A | Discharge status: Home / Self Care | Payer: Medicaid Other | Attending: Emergency Medicine | Admitting: Emergency Medicine

## 2014-08-23 DIAGNOSIS — Z3202 Encounter for pregnancy test, result negative: Secondary | ICD-10-CM | POA: Insufficient documentation

## 2014-08-23 DIAGNOSIS — Z720 Tobacco use: Secondary | ICD-10-CM | POA: Insufficient documentation

## 2014-08-23 DIAGNOSIS — R1010 Upper abdominal pain, unspecified: Secondary | ICD-10-CM | POA: Diagnosis present

## 2014-08-23 DIAGNOSIS — Z8659 Personal history of other mental and behavioral disorders: Secondary | ICD-10-CM | POA: Diagnosis not present

## 2014-08-23 DIAGNOSIS — K2970 Gastritis, unspecified, without bleeding: Secondary | ICD-10-CM | POA: Diagnosis not present

## 2014-08-23 LAB — URINE MICROSCOPIC-ADD ON

## 2014-08-23 LAB — CBC
HCT: 41.7 % (ref 36.0–46.0)
Hemoglobin: 14.4 g/dL (ref 12.0–15.0)
MCH: 28.6 pg (ref 26.0–34.0)
MCHC: 34.5 g/dL (ref 30.0–36.0)
MCV: 82.7 fL (ref 78.0–100.0)
Platelets: 273 10*3/uL (ref 150–400)
RBC: 5.04 MIL/uL (ref 3.87–5.11)
RDW: 14.5 % (ref 11.5–15.5)
WBC: 7.1 10*3/uL (ref 4.0–10.5)

## 2014-08-23 LAB — URINALYSIS, ROUTINE W REFLEX MICROSCOPIC
Bilirubin Urine: NEGATIVE
Glucose, UA: NEGATIVE mg/dL
Hgb urine dipstick: NEGATIVE
Ketones, ur: NEGATIVE mg/dL
Nitrite: NEGATIVE
PROTEIN: NEGATIVE mg/dL
Specific Gravity, Urine: 1.018 (ref 1.005–1.030)
Urobilinogen, UA: 0.2 mg/dL (ref 0.0–1.0)
pH: 5.5 (ref 5.0–8.0)

## 2014-08-23 LAB — POC URINE PREG, ED: Preg Test, Ur: NEGATIVE

## 2014-08-23 NOTE — ED Notes (Signed)
Pt. reports upper abdominal pain with nausea onset this evening , no emesis or diarrhea , denies dysuria or fever .

## 2014-08-23 NOTE — ED Provider Notes (Signed)
TIME SEEN: 11:59 PM   CHIEF COMPLAINT: Abdominal Pain  HPI: Rachel Calhoun is a 19 y.o. female, with hx of hernia at age 19 and cholecystectomy, who presents to the Emergency Department complaining of moderate upper abdominal pain that radiates to upper back onset at 8:45PM today. She states that she was using the bathroom and felt like she pulled or strained something during onset. She reports onset of a burning pain to upper abdomen and states that it felt like a "balloon was expanding inside me", which then radiated to upper back. She states that her abdomen currently feels very tight. She currently reports associated nausea. She states she took 1 aspirin yesterday but denies daily intake of NSAIDs. She denies any intake of ETOH. She denies associated fever, chills, vomiting, diarrhea, dark stool, dysuria, hematuria, vaginal bleeding, vaginal discharge. She states her current symptoms are not similar to those felt during GB flare up. No modifying factors noted. She denies associated GERD. LNMP was 1 month ago. Pain is described as moderate to severe.  ROS: See HPI Constitutional: no fever  Eyes: no drainage  ENT: no runny nose   Cardiovascular:  no chest pain  Resp: no SOB  GI: no vomiting; nausea; abdominal pain GU: no dysuria Integumentary: no rash  Allergy: no hives  Musculoskeletal: no leg swelling; back pain Neurological: no slurred speech ROS otherwise negative  PAST MEDICAL HISTORY/PAST SURGICAL HISTORY:  Past Medical History  Diagnosis Date  . Anxiety   . Gallstones     MEDICATIONS:  Prior to Admission medications   Medication Sig Start Date End Date Taking? Authorizing Provider  acetaminophen (TYLENOL) 325 MG tablet Take 650 mg by mouth every 6 (six) hours as needed for mild pain or headache.   Yes Historical Provider, MD  cyclobenzaprine (FLEXERIL) 5 MG tablet Take 1 tablet (5 mg total) by mouth 3 (three) times daily as needed for muscle spasms. Patient not taking: Reported  on 08/23/2014 09/15/13   Yolande Jollyaleb G Melancon, MD  HYDROcodone-acetaminophen (NORCO) 5-325 MG per tablet Take 1-2 tablets by mouth every 6 (six) hours as needed (for pain). Patient not taking: Reported on 08/23/2014 07/26/14   Paula LibraJohn Molpus, MD  nitrofurantoin, macrocrystal-monohydrate, (MACROBID) 100 MG capsule Take 1 capsule (100 mg total) by mouth 2 (two) times daily. X 7 days Patient not taking: Reported on 08/23/2014 04/15/13   Paula LibraJohn Molpus, MD    ALLERGIES:  Allergies  Allergen Reactions  . Clindamycin/Lincomycin     Uncontrollable fever  . Pb-Hyoscy-Atropine-Scopolamine     Uncontrollable fever     SOCIAL HISTORY:  History  Substance Use Topics  . Smoking status: Current Every Day Smoker  . Smokeless tobacco: Not on file  . Alcohol Use: No    FAMILY HISTORY: No family history on file.  EXAM: Triage Vitals: BP 112/69 mmHg  Pulse 79  Temp(Src) 98.1 F (36.7 C) (Oral)  Resp 20  Wt 212 lb (96.163 kg)  SpO2 97%  LMP 07/25/2014  CONSTITUTIONAL: Alert and oriented and responds appropriately to questions. Well-appearing; well-nourished, afebrile  HEAD: Normocephalic EYES: Conjunctivae clear, PERRL ENT: normal nose; no rhinorrhea; moist mucous membranes; pharynx without lesions noted NECK: Supple, no meningismus, no LAD  CARD: RRR; S1 and S2 appreciated; no murmurs, no clicks, no rubs, no gallops RESP: Normal chest excursion without splinting or tachypnea; breath sounds clear and equal bilaterally; no wheezes, no rhonchi, no rales, no hypoxia or respiratory distress, speaking full sentences ABD/GI: Normal bowel sounds; non-distended; soft, non-tender, no rebound,  no guarding, no peritoneal signs; Minimally tender with palpation to epigastric region. BACK:  The back appears normal and is non-tender to palpation, there is no CVA tenderness EXT: Normal ROM in all joints; non-tender to palpation; no edema; normal capillary refill; no cyanosis, no calf tenderness or swelling    SKIN:  Normal color for age and race; warm NEURO: Moves all extremities equally, sensation to light touch intact diffusely, cranial nerves II through XII intact PSYCH: The patient's mood and manner are appropriate. Grooming and personal hygiene are appropriate.  MEDICAL DECISION MAKING: Patient here with epigastric pain. Her abdominal exam is benign. Nonsurgical. Do not feel she needs emergent imaging. Her labs are unremarkable. Pain is improved with GI cocktail, Protonix and Zofran. She is drinking without difficulty. Suspect this is gastritis, GERD. Have recommend to changes in her diet, avoiding NSAIDs and alcohol. We'll discharge with prescription for Zofran, Protonix and Carafate. Discussed return precautions. Patient and mother verbalizes understanding and are comfortable with this plan.   I personally performed the services described in this documentation, which was scribed in my presence. The recorded information has been reviewed and is accurate.   Layla Maw Ward, DO 08/24/14 [phone]

## 2014-08-24 LAB — COMPREHENSIVE METABOLIC PANEL
ALK PHOS: 77 U/L (ref 38–126)
ALT: 21 U/L (ref 14–54)
ANION GAP: 7 (ref 5–15)
AST: 17 U/L (ref 15–41)
Albumin: 4.1 g/dL (ref 3.5–5.0)
BUN: 16 mg/dL (ref 6–20)
CALCIUM: 9.1 mg/dL (ref 8.9–10.3)
CO2: 21 mmol/L — ABNORMAL LOW (ref 22–32)
CREATININE: 0.69 mg/dL (ref 0.44–1.00)
Chloride: 108 mmol/L (ref 101–111)
Glucose, Bld: 102 mg/dL — ABNORMAL HIGH (ref 65–99)
POTASSIUM: 3.7 mmol/L (ref 3.5–5.1)
SODIUM: 136 mmol/L (ref 135–145)
Total Bilirubin: 1.3 mg/dL — ABNORMAL HIGH (ref 0.3–1.2)
Total Protein: 7.2 g/dL (ref 6.5–8.1)

## 2014-08-24 LAB — LIPASE, BLOOD: LIPASE: 18 U/L — AB (ref 22–51)

## 2014-08-24 MED ORDER — SUCRALFATE 1 G PO TABS: 1.0000 g | ORAL_TABLET | Freq: Three times a day (TID) | ORAL | Status: DC

## 2014-08-24 MED ORDER — PANTOPRAZOLE SODIUM 40 MG PO TBEC: 40.0000 mg | DELAYED_RELEASE_TABLET | Freq: Every day | ORAL | Status: DC

## 2014-08-24 MED ORDER — ONDANSETRON 4 MG PO TBDP
4.0000 mg | ORAL_TABLET | Freq: Once | ORAL | Status: AC
Start: 2014-08-24 — End: 2014-08-24
  Administered 2014-08-24: 4 mg via ORAL
  Filled 2014-08-24: qty 1

## 2014-08-24 MED ORDER — ONDANSETRON 4 MG PO TBDP: 4.0000 mg | ORAL_TABLET | Freq: Three times a day (TID) | ORAL | Status: DC | PRN

## 2014-08-24 MED ORDER — GI COCKTAIL ~~LOC~~
30.0000 mL | Freq: Once | ORAL | Status: AC
  Administered 2014-08-24: 30 mL via ORAL
  Filled 2014-08-24: qty 30

## 2014-08-24 MED ORDER — PANTOPRAZOLE SODIUM 40 MG PO TBEC
40.0000 mg | DELAYED_RELEASE_TABLET | Freq: Once | ORAL | Status: AC
  Administered 2014-08-24: 40 mg via ORAL
  Filled 2014-08-24: qty 1

## 2014-08-24 NOTE — Discharge Instructions (Signed)
Gastritis, Adult Gastritis is soreness and swelling (inflammation) of the lining of the stomach. Gastritis can develop as a sudden onset (acute) or long-term (chronic) condition. If gastritis is not treated, it can lead to stomach bleeding and ulcers. CAUSES  Gastritis occurs when the stomach lining is weak or damaged. Digestive juices from the stomach then inflame the weakened stomach lining. The stomach lining may be weak or damaged due to viral or bacterial infections. One common bacterial infection is the Helicobacter pylori infection. Gastritis can also result from excessive alcohol consumption, taking certain medicines, or having too much acid in the stomach.  SYMPTOMS  In some cases, there are no symptoms. When symptoms are present, they may include:  Pain or a burning sensation in the upper abdomen.  Nausea.  Vomiting.  An uncomfortable feeling of fullness after eating. DIAGNOSIS  Your caregiver may suspect you have gastritis based on your symptoms and a physical exam. To determine the cause of your gastritis, your caregiver may perform the following:  Blood or stool tests to check for the H pylori bacterium.  Gastroscopy. A thin, flexible tube (endoscope) is passed down the esophagus and into the stomach. The endoscope has a light and camera on the end. Your caregiver uses the endoscope to view the inside of the stomach.  Taking a tissue sample (biopsy) from the stomach to examine under a microscope. TREATMENT  Depending on the cause of your gastritis, medicines may be prescribed. If you have a bacterial infection, such as an H pylori infection, antibiotics may be given. If your gastritis is caused by too much acid in the stomach, H2 blockers or antacids may be given. Your caregiver may recommend that you stop taking aspirin, ibuprofen, or other nonsteroidal anti-inflammatory drugs (NSAIDs). HOME CARE INSTRUCTIONS  Only take over-the-counter or prescription medicines as directed by  your caregiver.  If you were given antibiotic medicines, take them as directed. Finish them even if you start to feel better.  Drink enough fluids to keep your urine clear or pale yellow.  Avoid foods and drinks that make your symptoms worse, such as:  Caffeine or alcoholic drinks.  Chocolate.  Peppermint or mint flavorings.  Garlic and onions.  Spicy foods.  Citrus fruits, such as oranges, lemons, or limes.  Tomato-based foods such as sauce, chili, salsa, and pizza.  Fried and fatty foods.  Eat small, frequent meals instead of large meals. SEEK IMMEDIATE MEDICAL CARE IF:   You have black or dark red stools.  You vomit blood or material that looks like coffee grounds.  You are unable to keep fluids down.  Your abdominal pain gets worse.  You have a fever.  You do not feel better after 1 week.  You have any other questions or concerns. MAKE SURE YOU:  Understand these instructions.  Will watch your condition.  Will get help right away if you are not doing well or get worse. Document Released: 01/14/2001 Document Revised: 07/22/2011 Document Reviewed: 03/05/2011 Exit[employer] Patient Information 2015 [employer]. This information is not intended to replace advice given to you by your health care provider. Make sure you discuss any questions you have with your health care provider.  Food Choices for Gastroesophageal Reflux Disease When you have gastroesophageal reflux disease (GERD), the foods you eat and your eating habits are very important. Choosing the right foods can help ease the discomfort of GERD. WHAT GENERAL GUIDELINES DO I NEED TO FOLLOW?  Choose fruits, vegetables, whole grains, low-fat dairy products, and low-fat   meat, fish, and poultry.  Limit fats such as oils, salad dressings, butter, nuts, and avocado.  Keep a food diary to identify foods that cause symptoms.  Avoid foods that cause reflux. These may be different for different people.  Eat  frequent small meals instead of three large meals each day.  Eat your meals slowly, in a relaxed setting.  Limit fried foods.  Cook foods using methods other than frying.  Avoid drinking alcohol.  Avoid drinking large amounts of liquids with your meals.  Avoid bending over or lying down until 2-3 hours after eating. WHAT FOODS ARE NOT RECOMMENDED? The following are some foods and drinks that may worsen your symptoms: Vegetables Tomatoes. Tomato juice. Tomato and spaghetti sauce. Chili peppers. Onion and garlic. Horseradish. Fruits Oranges, grapefruit, and lemon (fruit and juice). Meats High-fat meats, fish, and poultry. This includes hot dogs, ribs, ham, sausage, salami, and bacon. Dairy Whole milk and chocolate milk. Sour cream. Cream. Butter. Ice cream. Cream cheese.  Beverages Coffee and tea, with or without caffeine. Carbonated beverages or energy drinks. Condiments Hot sauce. Barbecue sauce.  Sweets/Desserts Chocolate and cocoa. Donuts. Peppermint and spearmint. Fats and Oils High-fat foods, including French fries and potato chips. Other Vinegar. Strong spices, such as black pepper, white pepper, red pepper, cayenne, curry powder, cloves, ginger, and chili powder. The items listed above may not be a complete list of foods and beverages to avoid. Contact your dietitian for more information. Document Released: 01/20/2005 Document Revised: 01/25/2013 Document Reviewed: 11/24/2012 [employer] Patient Information 2015 [employer]. This information is not intended to replace advice given to you by your health care provider. Make sure you discuss any questions you have with your health care provider.   

## 2014-09-07 ENCOUNTER — Encounter (HOSPITAL_BASED_OUTPATIENT_CLINIC_OR_DEPARTMENT_OTHER): Payer: Self-pay | Admitting: *Deleted

## 2014-09-07 ENCOUNTER — Emergency Department (HOSPITAL_BASED_OUTPATIENT_CLINIC_OR_DEPARTMENT_OTHER): Payer: Medicaid Other

## 2014-09-07 ENCOUNTER — Emergency Department (HOSPITAL_BASED_OUTPATIENT_CLINIC_OR_DEPARTMENT_OTHER)
Admission: EM | Admit: 2014-09-07 | Discharge: 2014-09-07 | Disposition: A | Discharge status: Home / Self Care | Payer: Medicaid Other | Attending: Emergency Medicine | Admitting: Emergency Medicine

## 2014-09-07 DIAGNOSIS — Z8659 Personal history of other mental and behavioral disorders: Secondary | ICD-10-CM | POA: Diagnosis not present

## 2014-09-07 DIAGNOSIS — Z79899 Other long term (current) drug therapy: Secondary | ICD-10-CM | POA: Insufficient documentation

## 2014-09-07 DIAGNOSIS — Z8719 Personal history of other diseases of the digestive system: Secondary | ICD-10-CM | POA: Insufficient documentation

## 2014-09-07 DIAGNOSIS — Y990 Civilian activity done for income or pay: Secondary | ICD-10-CM | POA: Diagnosis not present

## 2014-09-07 DIAGNOSIS — W228XXA Striking against or struck by other objects, initial encounter: Secondary | ICD-10-CM | POA: Insufficient documentation

## 2014-09-07 DIAGNOSIS — Z720 Tobacco use: Secondary | ICD-10-CM | POA: Insufficient documentation

## 2014-09-07 DIAGNOSIS — S20212A Contusion of left front wall of thorax, initial encounter: Secondary | ICD-10-CM | POA: Insufficient documentation

## 2014-09-07 DIAGNOSIS — Y9289 Other specified places as the place of occurrence of the external cause: Secondary | ICD-10-CM | POA: Insufficient documentation

## 2014-09-07 DIAGNOSIS — Y9389 Activity, other specified: Secondary | ICD-10-CM | POA: Diagnosis not present

## 2014-09-07 DIAGNOSIS — S299XXA Unspecified injury of thorax, initial encounter: Secondary | ICD-10-CM | POA: Diagnosis present

## 2014-09-07 MED ORDER — HYDROCODONE-ACETAMINOPHEN 5-325 MG PO TABS
2.0000 | ORAL_TABLET | Freq: Once | ORAL | Status: AC
  Administered 2014-09-07: 2 via ORAL
  Filled 2014-09-07: qty 2

## 2014-09-07 MED ORDER — KETOROLAC TROMETHAMINE 30 MG/ML IJ SOLN
30.0000 mg | Freq: Once | INTRAMUSCULAR | Status: AC
  Administered 2014-09-07: 30 mg via INTRAVENOUS
  Filled 2014-09-07: qty 1

## 2014-09-07 MED ORDER — HYDROCODONE-ACETAMINOPHEN 5-325 MG PO TABS: 1.0000 | ORAL_TABLET | ORAL | Status: DC | PRN

## 2014-09-07 NOTE — ED Provider Notes (Signed)
TIME SEEN: 3:50 AM  CHIEF COMPLAINT: Left chest pain  HPI: Pt is a 19 y.o. female with history of anxiety, gallstones status post cholecystectomy who presents emergency department with left-sided chest pain that started yesterday but was worse while at work tonight. Describes the pain as sharp, moderate and radiates into her back. Worse with movement, palpation and deep inspiration. Denies shortness of breath, nausea, vomiting, diaphoresis or dizziness. No prior history of PE or DVT, recent surgery, fracture, trauma, prolonged immobilization. She does have Implanon and is a smoker. No fever or cough. Reports that yesterday at work she was hit in the chest with a box that she was trying to lift that she reports weighed over 100 pounds. States that this is what started her pain.  ROS: See HPI Constitutional: no fever  Eyes: no drainage  ENT: no runny nose   Cardiovascular:   chest pain  Resp: no SOB  GI: no vomiting GU: no dysuria Integumentary: no rash  Allergy: no hives  Musculoskeletal: no leg swelling  Neurological: no slurred speech ROS otherwise negative  PAST MEDICAL HISTORY/PAST SURGICAL HISTORY:  Past Medical History  Diagnosis Date  . Anxiety   . Gallstones     MEDICATIONS:  Prior to Admission medications   Medication Sig Start Date End Date Taking? Authorizing Provider  acetaminophen (TYLENOL) 325 MG tablet Take 650 mg by mouth every 6 (six) hours as needed for mild pain or headache.    Historical Provider, MD  ondansetron (ZOFRAN ODT) 4 MG disintegrating tablet Take 1 tablet (4 mg total) by mouth every 8 (eight) hours as needed for nausea or vomiting. 08/24/14   Kristen N Ward, DO  pantoprazole (PROTONIX) 40 MG tablet Take 1 tablet (40 mg total) by mouth daily. 08/24/14   Kristen N Ward, DO  sucralfate (CARAFATE) 1 G tablet Take 1 tablet (1 g total) by mouth 4 (four) times daily -  with meals and at bedtime. 08/24/14   Kristen N Ward, DO    ALLERGIES:  Allergies  Allergen  Reactions  . Clindamycin/Lincomycin     Uncontrollable fever  . Pb-Hyoscy-Atropine-Scopolamine     Uncontrollable fever     SOCIAL HISTORY:  History  Substance Use Topics  . Smoking status: Current Every Day Smoker  . Smokeless tobacco: Not on file  . Alcohol Use: No    FAMILY HISTORY: No family history on file.  EXAM: BP 117/62 mmHg  Pulse 104  Temp(Src) 99.1 F (37.3 C) (Oral)  Resp 20  Ht  (1.575 m)  Wt 210 lb (95.255 kg)  BMI 38.40 kg/m2  SpO2 96% CONSTITUTIONAL: Alert and oriented and responds appropriately to questions. Well-appearing; well-nourished, in no distress, afebrile, nontoxic HEAD: Normocephalic EYES: Conjunctivae clear, PERRL ENT: normal nose; no rhinorrhea; moist mucous membranes; pharynx without lesions noted NECK: Supple, no meningismus, no LAD  CARD: Regular and minimally tachycardic; S1 and S2 appreciated; no murmurs, no clicks, no rubs, no gallops CHEST:  Tender to palpation over the left chest wall which reproduces her pain, no crepitus or ecchymosis or deformity, no lesions noted to the chest wall RESP: Normal chest excursion without splinting or tachypnea; breath sounds clear and equal bilaterally; no wheezes, no rhonchi, no rales, no hypoxia or respiratory distress, speaking full sentences ABD/GI: Normal bowel sounds; non-distended; soft, non-tender, no rebound, no guarding, no peritoneal signs BACK:  The back appears normal and is non-tender to palpation, there is no CVA tenderness EXT: Normal ROM in all joints; non-tender to palpation;  no edema; normal capillary refill; no cyanosis, no calf tenderness or swelling    SKIN: Normal color for age and race; warm NEURO: Moves all extremities equally, sensation to light touch intact diffusely, cranial nerves II through XII intact PSYCH: The patient's mood and manner are appropriate. Grooming and personal hygiene are appropriate.  MEDICAL DECISION MAKING: Patient here with what appears to be chest  wall pain, contusion. Patient reports she was hit in the chest with a box yesterday which started her pain. Pain is reproducible with palpation and movement. I have very low suspicion that this is a pulmonary embolus. EKG shows no ischemic changes. Patient was initially tachycardic in triage but immediately her heart rate has improved without intervention. We'll give pain medication and obtain chest x-ray.   ED PROGRESS: Chest x-ray shows no acute abnormality. Pain has improved with Vicodin and Toradol in the emergency department. We'll discharge with prescription for pain medication and work note. I do not feel she needs further emergent workup at this time. Discussed usual and customary return precautions. She verbalizes understanding and is comfortable with plan.    EKG Interpretation  Date/Time:  Thursday September 07 2014 03:49:14 EDT Ventricular Rate:  92 PR Interval:  138 QRS Duration: 76 QT Interval:  356 QTC Calculation: 440 R Axis:   48 Text Interpretation:  Normal sinus rhythm Normal ECG No significant change since last tracing Confirmed by WARD,  DO, KRISTEN [phone]) on 09/07/2014 3:49:42 AM        Layla Maw Ward, DO 09/07/14 [id number]

## 2014-09-07 NOTE — ED Notes (Signed)
Left sided chest pain goes through to back x 3 hours denies N/V diaphoresis or SHOB. Pain worsens on inspiration.

## 2014-09-07 NOTE — Discharge Instructions (Signed)

## 2014-10-04 ENCOUNTER — Emergency Department (HOSPITAL_BASED_OUTPATIENT_CLINIC_OR_DEPARTMENT_OTHER): Payer: Medicaid Other

## 2014-10-04 ENCOUNTER — Encounter (HOSPITAL_BASED_OUTPATIENT_CLINIC_OR_DEPARTMENT_OTHER): Payer: Self-pay

## 2014-10-04 ENCOUNTER — Emergency Department (HOSPITAL_BASED_OUTPATIENT_CLINIC_OR_DEPARTMENT_OTHER)
Admission: EM | Admit: 2014-10-04 | Discharge: 2014-10-04 | Disposition: A | Discharge status: Home / Self Care | Payer: Medicaid Other | Attending: Emergency Medicine | Admitting: Emergency Medicine

## 2014-10-04 DIAGNOSIS — Z720 Tobacco use: Secondary | ICD-10-CM | POA: Diagnosis not present

## 2014-10-04 DIAGNOSIS — M533 Sacrococcygeal disorders, not elsewhere classified: Secondary | ICD-10-CM | POA: Diagnosis present

## 2014-10-04 DIAGNOSIS — Z8659 Personal history of other mental and behavioral disorders: Secondary | ICD-10-CM | POA: Insufficient documentation

## 2014-10-04 DIAGNOSIS — Z8719 Personal history of other diseases of the digestive system: Secondary | ICD-10-CM | POA: Insufficient documentation

## 2014-10-04 DIAGNOSIS — Z3202 Encounter for pregnancy test, result negative: Secondary | ICD-10-CM | POA: Diagnosis not present

## 2014-10-04 DIAGNOSIS — M5432 Sciatica, left side: Secondary | ICD-10-CM | POA: Diagnosis not present

## 2014-10-04 DIAGNOSIS — Z79899 Other long term (current) drug therapy: Secondary | ICD-10-CM | POA: Insufficient documentation

## 2014-10-04 HISTORY — DX: Sciatica, unspecified side: M54.30

## 2014-10-04 LAB — PREGNANCY, URINE: Preg Test, Ur: NEGATIVE

## 2014-10-04 MED ORDER — PREDNISONE 50 MG PO TABS
60.0000 mg | ORAL_TABLET | Freq: Once | ORAL | Status: AC
  Administered 2014-10-04: 60 mg via ORAL
  Filled 2014-10-04 (×2): qty 1

## 2014-10-04 MED ORDER — MELOXICAM 15 MG PO TABS: 15.0000 mg | ORAL_TABLET | Freq: Every day | ORAL | Status: DC

## 2014-10-04 MED ORDER — PREDNISONE 20 MG PO TABS: ORAL_TABLET | ORAL | Status: DC

## 2014-10-04 MED ORDER — KETOROLAC TROMETHAMINE 60 MG/2ML IM SOLN
60.0000 mg | Freq: Once | INTRAMUSCULAR | Status: AC
  Administered 2014-10-04: 60 mg via INTRAMUSCULAR
  Filled 2014-10-04: qty 2

## 2014-10-04 MED ORDER — METHOCARBAMOL 500 MG PO TABS
1000.0000 mg | ORAL_TABLET | Freq: Once | ORAL | Status: AC
Start: 2014-10-04 — End: 2014-10-04
  Administered 2014-10-04: 1000 mg via ORAL
  Filled 2014-10-04: qty 2

## 2014-10-04 NOTE — ED Notes (Signed)
Pt c/o left side lower back pain that radiates down her leg, hx of sciatica, no incontinence, no numbness or tingling in groin, no dysuria.

## 2014-10-04 NOTE — Discharge Instructions (Signed)

## 2014-10-04 NOTE — ED Notes (Signed)
Pt verbalizes understanding of d/c instructions and denies any further needs at this time. 

## 2014-10-04 NOTE — ED Provider Notes (Signed)
CSN: [id number]     Arrival date & time 10/04/14  0350 History   None    Chief Complaint  Patient presents with  . Back Pain     (Consider location/radiation/quality/duration/timing/severity/associated sxs/prior Treatment) Patient is a 19 y.o. female presenting with back pain. The history is provided by the patient.  Back Pain Location:  Sacro-iliac joint Radiates to:  L posterior upper leg Pain severity:  Severe Pain is:  Same all the time Onset quality:  Sudden Timing:  Constant Progression:  Unchanged Chronicity:  Recurrent Context: lifting heavy objects   Context: not MCA and not MVA   Context comment:  Works for [employer] Relieved by:  Nothing Worsened by:  Nothing tried Ineffective treatments:  None tried Associated symptoms: no abdominal pain, no abdominal swelling, no bladder incontinence, no bowel incontinence, no chest pain, no dysuria, no fever, no headaches, no leg pain, no numbness, no paresthesias, no pelvic pain, no perianal numbness, no tingling, no weakness and no weight loss   Risk factors: not pregnant     Past Medical History  Diagnosis Date  . Anxiety   . Gallstones   . Sciatica    Past Surgical History  Procedure Laterality Date  . Hernia repair    . I&d extremity Right 12/18/2012    Procedure: IRRIGATION AND DEBRIDEMENT RIGHT RING FINGER;  Surgeon: Tami Ribas, MD;  Location: [facility] OR;  Service: Orthopedics;  Laterality: Right;  . Cholecystectomy     No family history on file. Social History  Substance Use Topics  . Smoking status: Current Every Day Smoker  . Smokeless tobacco: None  . Alcohol Use: No   OB History    Gravida Para Term Preterm AB TAB SAB Ectopic Multiple Living   1              Review of Systems  Constitutional: Negative for fever and weight loss.  Cardiovascular: Negative for chest pain.  Gastrointestinal: Negative for abdominal pain and bowel incontinence.  Genitourinary: Negative for bladder incontinence, dysuria  and pelvic pain.  Musculoskeletal: Positive for back pain.  Neurological: Negative for tingling, weakness, numbness, headaches and paresthesias.  All other systems reviewed and are negative.     Allergies  Clindamycin/lincomycin and Pb-hyoscy-atropine-scopolamine  Home Medications   Prior to Admission medications   Medication Sig Start Date End Date Taking? Authorizing Provider  acetaminophen (TYLENOL) 325 MG tablet Take 650 mg by mouth every 6 (six) hours as needed for mild pain or headache.    Historical Provider, MD  HYDROcodone-acetaminophen (NORCO/VICODIN) 5-325 MG per tablet Take 1 tablet by mouth every 4 (four) hours as needed. 09/07/14   Kristen N Ward, DO  ibuprofen (ADVIL,MOTRIN) 200 MG tablet Take 200 mg by mouth every 6 (six) hours as needed.    Historical Provider, MD  ondansetron (ZOFRAN ODT) 4 MG disintegrating tablet Take 1 tablet (4 mg total) by mouth every 8 (eight) hours as needed for nausea or vomiting. 08/24/14   Kristen N Ward, DO  pantoprazole (PROTONIX) 40 MG tablet Take 1 tablet (40 mg total) by mouth daily. 08/24/14   Kristen N Ward, DO  sucralfate (CARAFATE) 1 G tablet Take 1 tablet (1 g total) by mouth 4 (four) times daily -  with meals and at bedtime. 08/24/14   Kristen N Ward, DO   BP 119/65 mmHg  Pulse 104  Temp(Src) 98.6 F (37 C) (Oral)  Resp 20  Ht  (1.575 m)  Wt 209 lb (94.802 kg)  BMI 38.22 kg/m2  SpO2 98% Physical Exam  Constitutional: She is oriented to person, place, and time. She appears well-developed and well-nourished. No distress.  HENT:  Head: Normocephalic and atraumatic.  Mouth/Throat: Oropharynx is clear and moist.  Eyes: Conjunctivae are normal. Pupils are equal, round, and reactive to light.  Neck: Normal range of motion. Neck supple.  Cardiovascular: Normal rate, regular rhythm and intact distal pulses.   Pulmonary/Chest: Effort normal and breath sounds normal. No respiratory distress. She has no wheezes. She has no rales.   Abdominal: Soft. Bowel sounds are normal. She exhibits no distension. There is no tenderness. There is no rebound and no guarding.  Musculoskeletal: Normal range of motion. She exhibits no edema.  Neurological: She is alert and oriented to person, place, and time. She has normal reflexes.  Skin: Skin is warm and dry.  Psychiatric: She has a normal mood and affect.    ED Course  Procedures (including critical care time) Labs Review Labs Reviewed  PREGNANCY, URINE    Imaging Review No results found. I have personally reviewed and evaluated these images and lab results as part of my medical decision-making.   EKG Interpretation None      MDM   Final diagnoses:  None    Patient recently filled RX for hydrocodone on 09/08/14 and then was seen 10/02/14 at a [facility] (see care everywhere) and prescribed tramadol #30 and also baclofen and relafen.  Will give an IM dose of toradol and prednisone and have patient follow up with her PMD for ongoing care.  Will initiate steroids.      Cy Blamer, MD 10/04/14 [phone]

## 2014-12-02 ENCOUNTER — Encounter (HOSPITAL_BASED_OUTPATIENT_CLINIC_OR_DEPARTMENT_OTHER): Payer: Self-pay

## 2014-12-02 ENCOUNTER — Emergency Department (HOSPITAL_BASED_OUTPATIENT_CLINIC_OR_DEPARTMENT_OTHER)
Admission: EM | Admit: 2014-12-02 | Discharge: 2014-12-02 | Disposition: A | Discharge status: Home / Self Care | Payer: Medicaid Other | Attending: Emergency Medicine | Admitting: Emergency Medicine

## 2014-12-02 DIAGNOSIS — N939 Abnormal uterine and vaginal bleeding, unspecified: Secondary | ICD-10-CM | POA: Insufficient documentation

## 2014-12-02 DIAGNOSIS — N923 Ovulation bleeding: Secondary | ICD-10-CM

## 2014-12-02 DIAGNOSIS — Z8739 Personal history of other diseases of the musculoskeletal system and connective tissue: Secondary | ICD-10-CM | POA: Insufficient documentation

## 2014-12-02 DIAGNOSIS — Z87891 Personal history of nicotine dependence: Secondary | ICD-10-CM | POA: Diagnosis not present

## 2014-12-02 DIAGNOSIS — Z8719 Personal history of other diseases of the digestive system: Secondary | ICD-10-CM | POA: Diagnosis not present

## 2014-12-02 DIAGNOSIS — F419 Anxiety disorder, unspecified: Secondary | ICD-10-CM | POA: Insufficient documentation

## 2014-12-02 DIAGNOSIS — Z79899 Other long term (current) drug therapy: Secondary | ICD-10-CM | POA: Insufficient documentation

## 2014-12-02 NOTE — Discharge Instructions (Signed)
You have been seen today for vaginal bleeding. It is recommended that you follow up with your OBGYN on this matter. Follow up with PCP as needed. Return to ED should symptoms worsen.

## 2014-12-02 NOTE — ED Provider Notes (Signed)
CSN: [id number]     Arrival date & time 12/02/14  2129 History   First MD Initiated Contact with Patient 12/02/14 2157     Chief Complaint  Patient presents with  . Vaginal Bleeding     (Consider location/radiation/quality/duration/timing/severity/associated sxs/prior Treatment) HPI   Rachel Calhoun is a 19 y.o. female presents with vaginal bleeding beginning  Spotting blood, only visible when wiping after using restroom. Pt has not had to use any pads or tampons because the bleeding has not been heavy enough. Pt was treated for chlamydia (azithromycin) 4 days ago at her OB office following a positive chlamydia test.  Nexplanon 5 months ago. Hasn't had period since.    Past Medical History  Diagnosis Date  . Anxiety   . Gallstones   . Sciatica    Past Surgical History  Procedure Laterality Date  . Hernia repair    . I&d extremity Right 12/18/2012    Procedure: IRRIGATION AND DEBRIDEMENT RIGHT RING FINGER;  Surgeon: Tami RibasKevin R Kuzma, MD;  Location: [facility] OR;  Service: Orthopedics;  Laterality: Right;  . Cholecystectomy     History reviewed. No pertinent family history. Social History  Substance Use Topics  . Smoking status: Former Games developermoker  . Smokeless tobacco: None  . Alcohol Use: No   OB History    Gravida Para Term Preterm AB TAB SAB Ectopic Multiple Living   1              Review of Systems  Constitutional: Negative for fever, chills and diaphoresis.  Genitourinary: Positive for vaginal bleeding. Negative for flank pain, decreased urine volume, vaginal discharge, vaginal pain and pelvic pain.  All other systems reviewed and are negative.     Allergies  Clindamycin/lincomycin and Pb-hyoscy-atropine-scopolamine  Home Medications   Prior to Admission medications   Medication Sig Start Date End Date Taking? Authorizing Provider  acetaminophen (TYLENOL) 325 MG tablet Take 650 mg by mouth every 6 (six) hours as needed for mild pain or headache.    Historical Provider, MD   HYDROcodone-acetaminophen (NORCO/VICODIN) 5-325 MG per tablet Take 1 tablet by mouth every 4 (four) hours as needed. 09/07/14   Kristen N Ward, DO  ibuprofen (ADVIL,MOTRIN) 200 MG tablet Take 200 mg by mouth every 6 (six) hours as needed.    Historical Provider, MD  meloxicam (MOBIC) 15 MG tablet Take 1 tablet (15 mg total) by mouth daily. 10/04/14   April Palumbo, MD  ondansetron (ZOFRAN ODT) 4 MG disintegrating tablet Take 1 tablet (4 mg total) by mouth every 8 (eight) hours as needed for nausea or vomiting. 08/24/14   Kristen N Ward, DO  pantoprazole (PROTONIX) 40 MG tablet Take 1 tablet (40 mg total) by mouth daily. 08/24/14   Kristen N Ward, DO  predniSONE (DELTASONE) 20 MG tablet 3 tabs po day one, then 2 po daily x 4 days 10/04/14   April Palumbo, MD  sucralfate (CARAFATE) 1 G tablet Take 1 tablet (1 g total) by mouth 4 (four) times daily -  with meals and at bedtime. 08/24/14   Kristen N Ward, DO   BP 129/77 mmHg  Pulse 86  Temp(Src) 99.1 F (37.3 C) (Oral)  Resp 18  Ht 5\' 2"  (1.575 m)  Wt 209 lb (94.802 kg)  BMI 38.22 kg/m2  SpO2 100%  LMP 06/02/2014 Physical Exam  Constitutional: She appears well-developed and well-nourished.  Cardiovascular: Normal rate and regular rhythm.   Pulmonary/Chest: Effort normal.  Abdominal: Soft. There is no tenderness.  Neurological:  She is alert.  Skin: Skin is warm and dry.  Nursing note and vitals reviewed.   ED Course  Procedures (including critical care time) Labs Review Labs Reviewed - No data to display  Imaging Review No results found. I have personally reviewed and evaluated these images and lab results as part of my medical decision-making.   EKG Interpretation None      MDM   Final diagnoses:  Vaginal bleeding between periods    Rachel Calhoun presents with mid-cycle spotting.   Suspect this is due to nexplanon. Advised pt that if she was would like another STD test, then that could be done, but pt declined. Pt advised to  follow up with OBGYN on this matter. Pt and pt mother agreed to plan of care.     Anselm Pancoast, PA-C 12/02/14 2313  Margarita Grizzle, MD 12/03/14 [phone]

## 2014-12-02 NOTE — ED Notes (Signed)
Pt reports abnormal vaginal bleeding since 1pm today, states she is concerned because she is on Nexplanon and shouldn't have vaginal bleeding. Pt also received one dose of Azithromycin on Thursday at her OB office for Chlamydia, pt thinks that may be the reason for her vaginal bleeding.

## 2016-02-06 ENCOUNTER — Emergency Department (HOSPITAL_BASED_OUTPATIENT_CLINIC_OR_DEPARTMENT_OTHER)
Admission: EM | Admit: 2016-02-06 | Discharge: 2016-02-06 | Disposition: A | Discharge status: Home / Self Care | Payer: Medicaid Other | Attending: Emergency Medicine | Admitting: Emergency Medicine

## 2016-02-06 ENCOUNTER — Emergency Department (HOSPITAL_BASED_OUTPATIENT_CLINIC_OR_DEPARTMENT_OTHER): Payer: Medicaid Other

## 2016-02-06 ENCOUNTER — Encounter (HOSPITAL_BASED_OUTPATIENT_CLINIC_OR_DEPARTMENT_OTHER): Payer: Self-pay | Admitting: Emergency Medicine

## 2016-02-06 DIAGNOSIS — K2970 Gastritis, unspecified, without bleeding: Secondary | ICD-10-CM | POA: Insufficient documentation

## 2016-02-06 DIAGNOSIS — Z87891 Personal history of nicotine dependence: Secondary | ICD-10-CM | POA: Diagnosis not present

## 2016-02-06 DIAGNOSIS — R1013 Epigastric pain: Secondary | ICD-10-CM | POA: Diagnosis present

## 2016-02-06 LAB — URINALYSIS, ROUTINE W REFLEX MICROSCOPIC
Bilirubin Urine: NEGATIVE
Glucose, UA: NEGATIVE mg/dL
HGB URINE DIPSTICK: NEGATIVE
Ketones, ur: NEGATIVE mg/dL
Nitrite: NEGATIVE
PROTEIN: NEGATIVE mg/dL
SPECIFIC GRAVITY, URINE: 1.028 (ref 1.005–1.030)
pH: 5 (ref 5.0–8.0)

## 2016-02-06 LAB — URINALYSIS, MICROSCOPIC (REFLEX)

## 2016-02-06 LAB — CBC WITH DIFFERENTIAL/PLATELET
Basophils Absolute: 0 10*3/uL (ref 0.0–0.1)
Basophils Relative: 0 %
EOS ABS: 0.1 10*3/uL (ref 0.0–0.7)
Eosinophils Relative: 2 %
HCT: 42.3 % (ref 36.0–46.0)
Hemoglobin: 14.6 g/dL (ref 12.0–15.0)
Lymphocytes Relative: 30 %
Lymphs Abs: 2.1 10*3/uL (ref 0.7–4.0)
MCH: 29.7 pg (ref 26.0–34.0)
MCHC: 34.5 g/dL (ref 30.0–36.0)
MCV: 86 fL (ref 78.0–100.0)
MONOS PCT: 8 %
Monocytes Absolute: 0.6 10*3/uL (ref 0.1–1.0)
NEUTROS PCT: 60 %
Neutro Abs: 4.2 10*3/uL (ref 1.7–7.7)
Platelets: 257 10*3/uL (ref 150–400)
RBC: 4.92 MIL/uL (ref 3.87–5.11)
RDW: 13.1 % (ref 11.5–15.5)
WBC: 7.1 10*3/uL (ref 4.0–10.5)

## 2016-02-06 LAB — COMPREHENSIVE METABOLIC PANEL
ALT: 30 U/L (ref 14–54)
AST: 22 U/L (ref 15–41)
Albumin: 4.3 g/dL (ref 3.5–5.0)
Alkaline Phosphatase: 59 U/L (ref 38–126)
Anion gap: 6 (ref 5–15)
BUN: 13 mg/dL (ref 6–20)
CHLORIDE: 106 mmol/L (ref 101–111)
CO2: 25 mmol/L (ref 22–32)
CREATININE: 0.62 mg/dL (ref 0.44–1.00)
Calcium: 9.1 mg/dL (ref 8.9–10.3)
GFR calc Af Amer: >60 mL/min (ref >60)
GFR calc non Af Amer: >60 mL/min (ref >60)
Glucose, Bld: 91 mg/dL (ref 65–99)
Potassium: 3.7 mmol/L (ref 3.5–5.1)
SODIUM: 137 mmol/L (ref 135–145)
Total Bilirubin: 1.3 mg/dL — ABNORMAL HIGH (ref 0.3–1.2)
Total Protein: 7.5 g/dL (ref 6.5–8.1)

## 2016-02-06 LAB — LIPASE, BLOOD: LIPASE: 16 U/L (ref 11–51)

## 2016-02-06 LAB — PREGNANCY, URINE: Preg Test, Ur: NEGATIVE

## 2016-02-06 MED ORDER — OMEPRAZOLE 20 MG PO CPDR: 20.0000 mg | DELAYED_RELEASE_CAPSULE | Freq: Every day | ORAL | 0 refills | Status: DC

## 2016-02-06 MED ORDER — ALUM & MAG HYDROXIDE-SIMETH 200-200-20 MG/5ML PO SUSP
30.0000 mL | Freq: Once | ORAL | Status: AC
  Administered 2016-02-06: 30 mL via ORAL
  Filled 2016-02-06: qty 30

## 2016-02-06 NOTE — ED Notes (Signed)
Patient transported to Ultrasound 

## 2016-02-06 NOTE — ED Notes (Signed)
ED Provider at bedside. 

## 2016-02-06 NOTE — ED Provider Notes (Signed)
[facility]-EMERGENCY DEPT Provider Note   CSN: [id number] Arrival date & time: 02/06/16  1014     History   Chief Complaint Chief Complaint  Patient presents with  . Abdominal Pain    HPI Rachel Calhoun is a 21 y.o. female.  The history is provided by the patient. No language interpreter was used.  Abdominal Pain     Rachel Calhoun is a 21 y.o. female who presents to the Emergency Department complaining of abdominal pain.  She presents for evaluation of epigastric pain that started just prior to ED arrival. Pain is severe and crampy in nature. It radiates to her chest and back. She has been experiencing intermittent episodes since her gallbladder was removed in 2015. She occasionally has associated nausea and vomiting, not on this episode. No reports of fevers, shortness of breath, dysuria. She has chronic diarrhea, unchanged from baseline. Past Medical History:  Diagnosis Date  . Anxiety   . Gallstones   . Sciatica     Patient Active Problem List   Diagnosis Date Noted  . Paronychia 12/18/2012    Past Surgical History:  Procedure Laterality Date  . CHOLECYSTECTOMY    . HERNIA REPAIR    . I&D EXTREMITY Right 12/18/2012   Procedure: IRRIGATION AND DEBRIDEMENT RIGHT RING FINGER;  Surgeon: Tami Ribas, MD;  Location: [facility] OR;  Service: Orthopedics;  Laterality: Right;    OB History    Gravida Para Term Preterm AB Living   1             SAB TAB Ectopic Multiple Live Births                   Home Medications    Prior to Admission medications   Medication Sig Start Date End Date Taking? Authorizing Provider  acetaminophen (TYLENOL) 325 MG tablet Take 650 mg by mouth every 6 (six) hours as needed for mild pain or headache.    Historical Provider, MD  HYDROcodone-acetaminophen (NORCO/VICODIN) 5-325 MG per tablet Take 1 tablet by mouth every 4 (four) hours as needed. 09/07/14   Kristen N Ward, DO  ibuprofen (ADVIL,MOTRIN) 200 MG tablet Take 200 mg by mouth every 6 (six) hours as  needed.    Historical Provider, MD  meloxicam (MOBIC) 15 MG tablet Take 1 tablet (15 mg total) by mouth daily. 10/04/14   April Palumbo, MD  omeprazole (PRILOSEC) 20 MG capsule Take 1 capsule (20 mg total) by mouth daily. 02/06/16   Tilden Fossa, MD  ondansetron (ZOFRAN ODT) 4 MG disintegrating tablet Take 1 tablet (4 mg total) by mouth every 8 (eight) hours as needed for nausea or vomiting. 08/24/14   Kristen N Ward, DO  pantoprazole (PROTONIX) 40 MG tablet Take 1 tablet (40 mg total) by mouth daily. 08/24/14   Kristen N Ward, DO  predniSONE (DELTASONE) 20 MG tablet 3 tabs po day one, then 2 po daily x 4 days 10/04/14   April Palumbo, MD  sucralfate (CARAFATE) 1 G tablet Take 1 tablet (1 g total) by mouth 4 (four) times daily -  with meals and at bedtime. 08/24/14   Layla Maw Ward, DO    Family History No family history on file.  Social History Social History  Substance Use Topics  . Smoking status: Former Games developer  . Smokeless tobacco: Not on file  . Alcohol use No     Allergies   Clindamycin/lincomycin and Pb-hyoscy-atropine-scopolamine   Review of Systems Review of Systems  Gastrointestinal: Positive for  abdominal pain.  All other systems reviewed and are negative.    Physical Exam Updated Vital Signs BP 96/61 (BP Location: Right Arm)   Pulse 82   Temp 97.5 F (36.4 C) (Oral)   Resp 18   Ht 5\' 3"  (1.6 m)   Wt 234 lb (106.1 kg)   SpO2 98%   BMI 41.45 kg/m   Physical Exam  Constitutional: She is oriented to person, place, and time. She appears well-developed and well-nourished.  HENT:  Head: Normocephalic and atraumatic.  Cardiovascular: Normal rate and regular rhythm.   No murmur heard. Pulmonary/Chest: Effort normal and breath sounds normal. No respiratory distress.  Abdominal: Soft. There is no rebound and no guarding.  Mild to moderate epigastric and right upper quadrant tenderness  Musculoskeletal: She exhibits no edema or tenderness.  Neurological: She is alert  and oriented to person, place, and time.  Skin: Skin is warm and dry.  Psychiatric: She has a normal mood and affect. Her behavior is normal.  Nursing note and vitals reviewed.    ED Treatments / Results  Labs (all labs ordered are listed, but only abnormal results are displayed) Labs Reviewed  COMPREHENSIVE METABOLIC PANEL - Abnormal; Notable for the following:       Result Value   Total Bilirubin 1.3 (*)    All other components within normal limits  URINALYSIS, ROUTINE W REFLEX MICROSCOPIC - Abnormal; Notable for the following:    APPearance CLOUDY (*)    Leukocytes, UA MODERATE (*)    All other components within normal limits  URINALYSIS, MICROSCOPIC (REFLEX) - Abnormal; Notable for the following:    Bacteria, UA MANY (*)    Squamous Epithelial / LPF 6-30 (*)    All other components within normal limits  URINE CULTURE  CBC WITH DIFFERENTIAL/PLATELET  LIPASE, BLOOD  PREGNANCY, URINE    EKG  EKG Interpretation  Date/Time:  Wednesday February 06 2016 12:08:46 EST Ventricular Rate:  66 PR Interval:    QRS Duration: 85 QT Interval:  390 QTC Calculation: 409 R Axis:   66 Text Interpretation:  Sinus rhythm Confirmed by ZAMMIT  MD, JOSEPH ([id number]) on 02/07/2016 11:13:08 AM       Radiology [place] Abdomen Complete  Result Date: 02/06/2016 CLINICAL DATA:  Epigastric pain radiating to the left upper back dropped prior cholecystectomy in 2015 EXAM: ABDOMEN ULTRASOUND COMPLETE COMPARISON:  Abdomen films of 01/04/2016 FINDINGS: Gallbladder: The gallbladder has previously been resected. Common bile duct: Diameter: The common bile duct measures 3 mm although portions are obscured distally by bowel gas. Liver: The liver somewhat echogenic consistent with fatty infiltration. No focal hepatic abnormality is seen. IVC: No abnormality visualized. Pancreas: The pancreas is visualized with portions of the distal tail obscured by bowel gas. No pancreatic ductal dilatation seen. Spleen: Spleen  measures 7.2 cm. Right Kidney: Length: 9.3 cm.  No hydronephrosis is seen Left Kidney: Length: 10.9 cm.  No hydronephrosis is noted. Abdominal aorta: The abdominal aorta is obscured by bowel gas. Other findings: A large amount of bowel gas obscures some anatomic detail. IMPRESSION: 1. Echogenic liver parenchyma consistent with fatty infiltration. No focal hepatic abnormality. 2. Only the tail of the pancreas is obscured by bowel gas. 3. Body habitus and bowel gas obscure some anatomic detail. Electronically Signed   By: Dwyane DeePaul  Barry M.D.   On: 02/06/2016 13:09    Procedures Procedures (including critical care time)  Medications Ordered in ED Medications  alum & mag hydroxide-simeth (MAALOX/MYLANTA) 200-200-20 MG/5ML suspension 30 mL (  30 mLs Oral Given 02/06/16 1144)     Initial Impression / Assessment and Plan / ED Course  I have reviewed the triage vital signs and the nursing notes.  Pertinent labs & imaging results that were available during my care of the patient were reviewed by me and considered in my medical decision making (see chart for details).  Clinical Course     Patient with history of cholecystectomy here with epigastric pain. Her pain improved in the emergency department after treatment. The labs are at her baseline. Ultrasound with no evidence of common bile duct stone. Discussed with patient unclear etiology of her recurrent abdominal pain. Discussed considering initiating PPI as well as GI follow-up for further evaluation of her recurrent abdominal pain. Home care and return precautions discussed.  Final Clinical Impressions(s) / ED Diagnoses   Final diagnoses:  Gastritis without bleeding, unspecified chronicity, unspecified gastritis type    New Prescriptions Discharge Medication List as of 02/06/2016  1:18 PM    START taking these medications   Details  omeprazole (PRILOSEC) 20 MG capsule Take 1 capsule (20 mg total) by mouth daily., Starting Wed 02/06/2016, Print           Tilden Fossa, MD 02/07/16 1336

## 2016-02-06 NOTE — ED Notes (Signed)
Lab work being collected

## 2016-02-06 NOTE — ED Triage Notes (Signed)
[facility]ia [facility] Pt with sudden onset of upper abdominal pain. Pt states "I think I may have a gallstone stuck in a duct." Hx of gallbladder removal 2015.

## 2016-02-07 LAB — URINE CULTURE

## 2016-02-16 IMAGING — DX DG LUMBAR SPINE COMPLETE 4+V
5 series · 5 of 5 positions shown · non-contrast
Comparison: None.

CLINICAL DATA: Left lower back pain radiating down the legs

EXAM:
LUMBAR SPINE - COMPLETE 4+ VIEW

[l-spine ap]
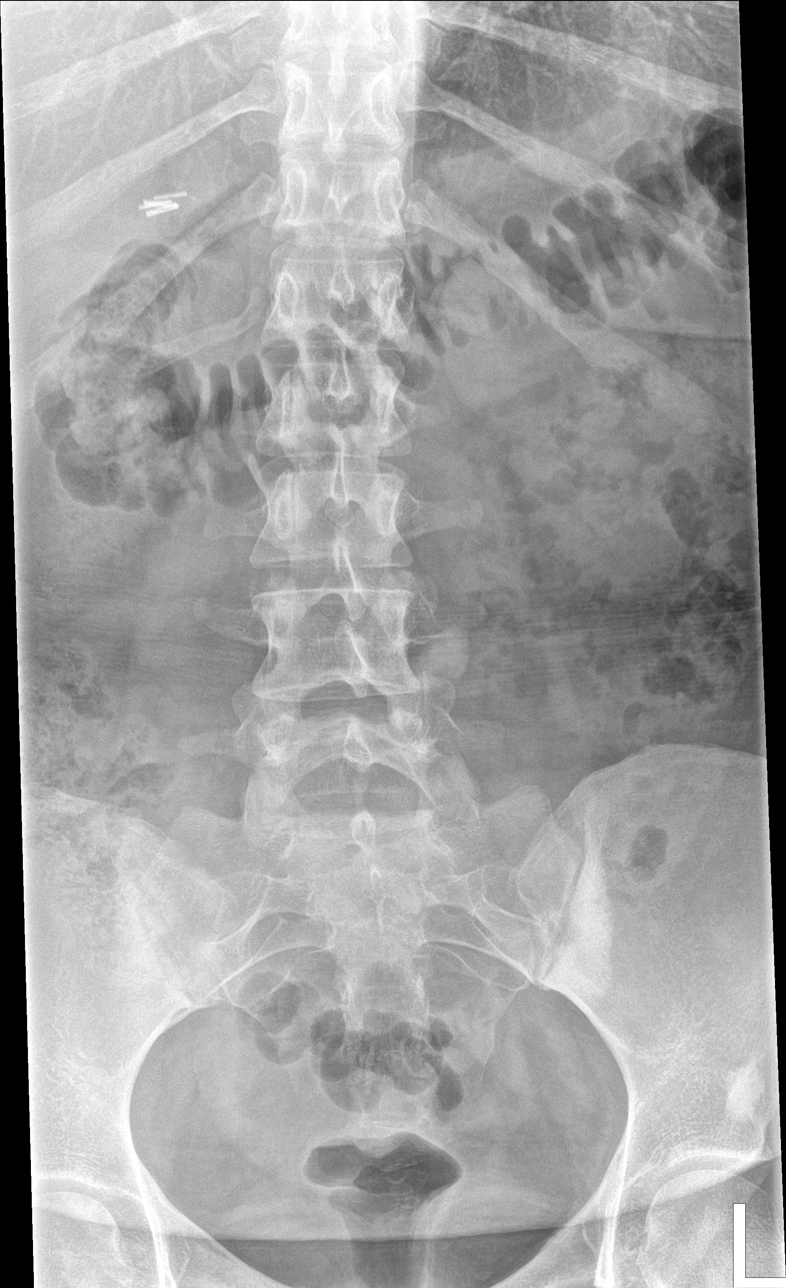

[l-spine obl (1 of 2)]
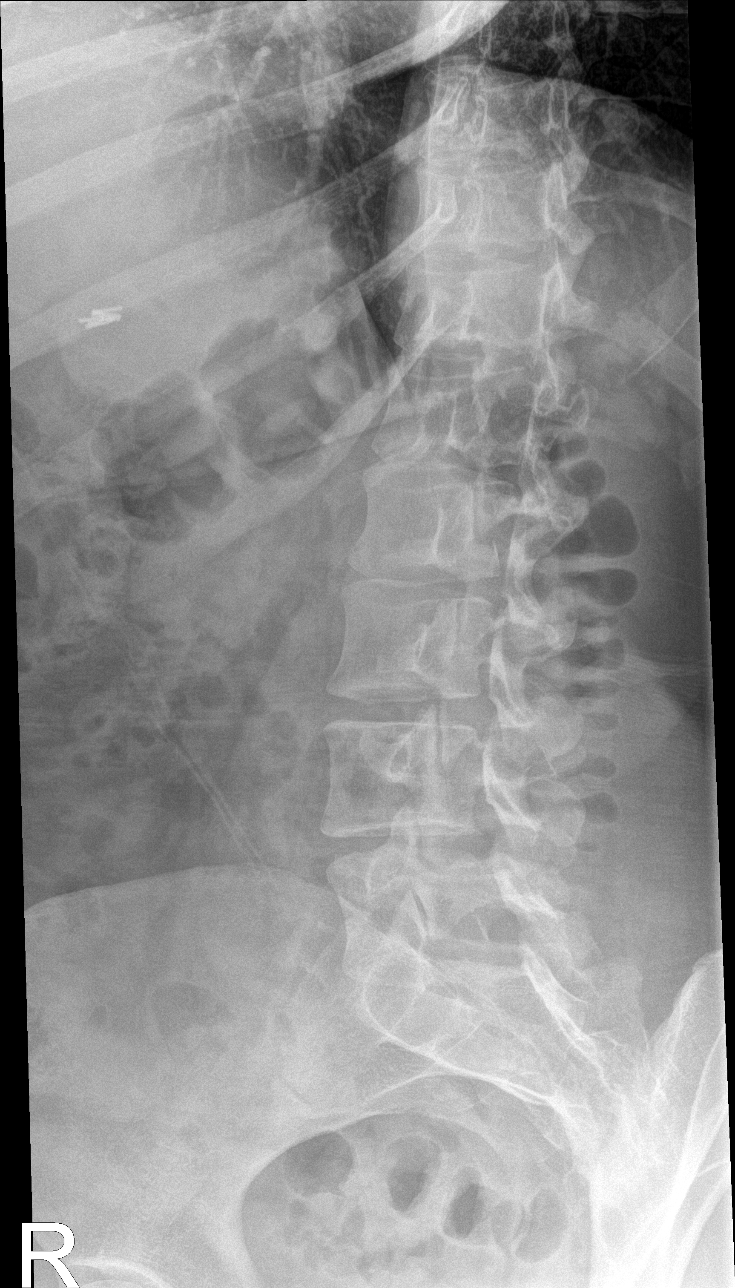

[l-spine obl (2 of 2)]
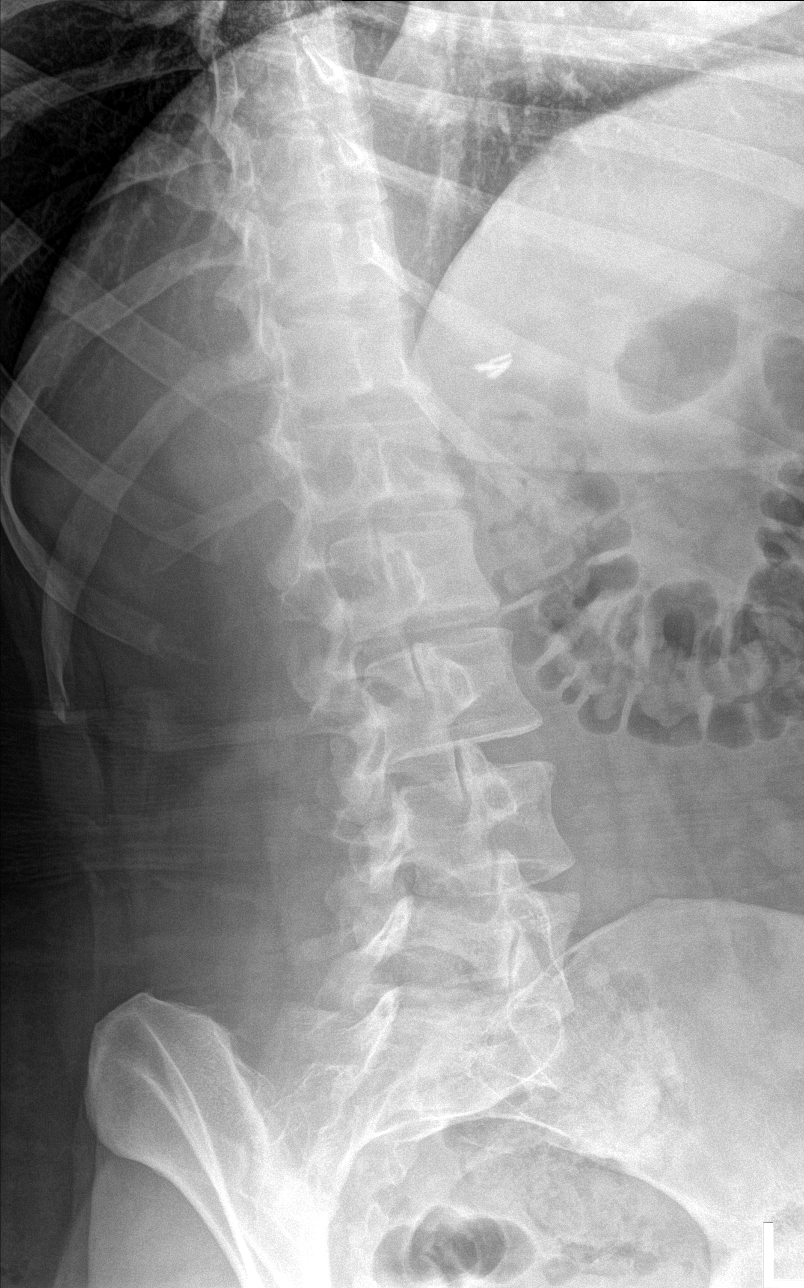

[l-spine lat]
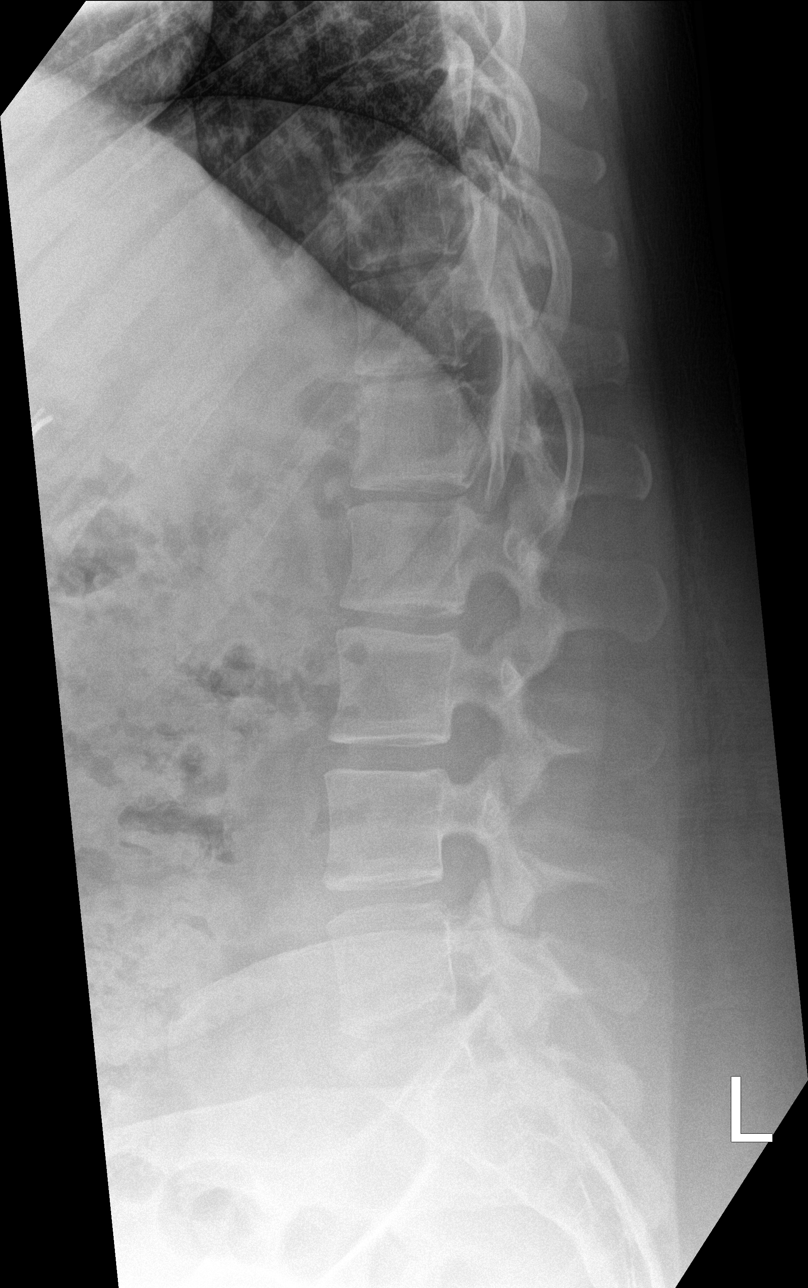

[l-spine spot]
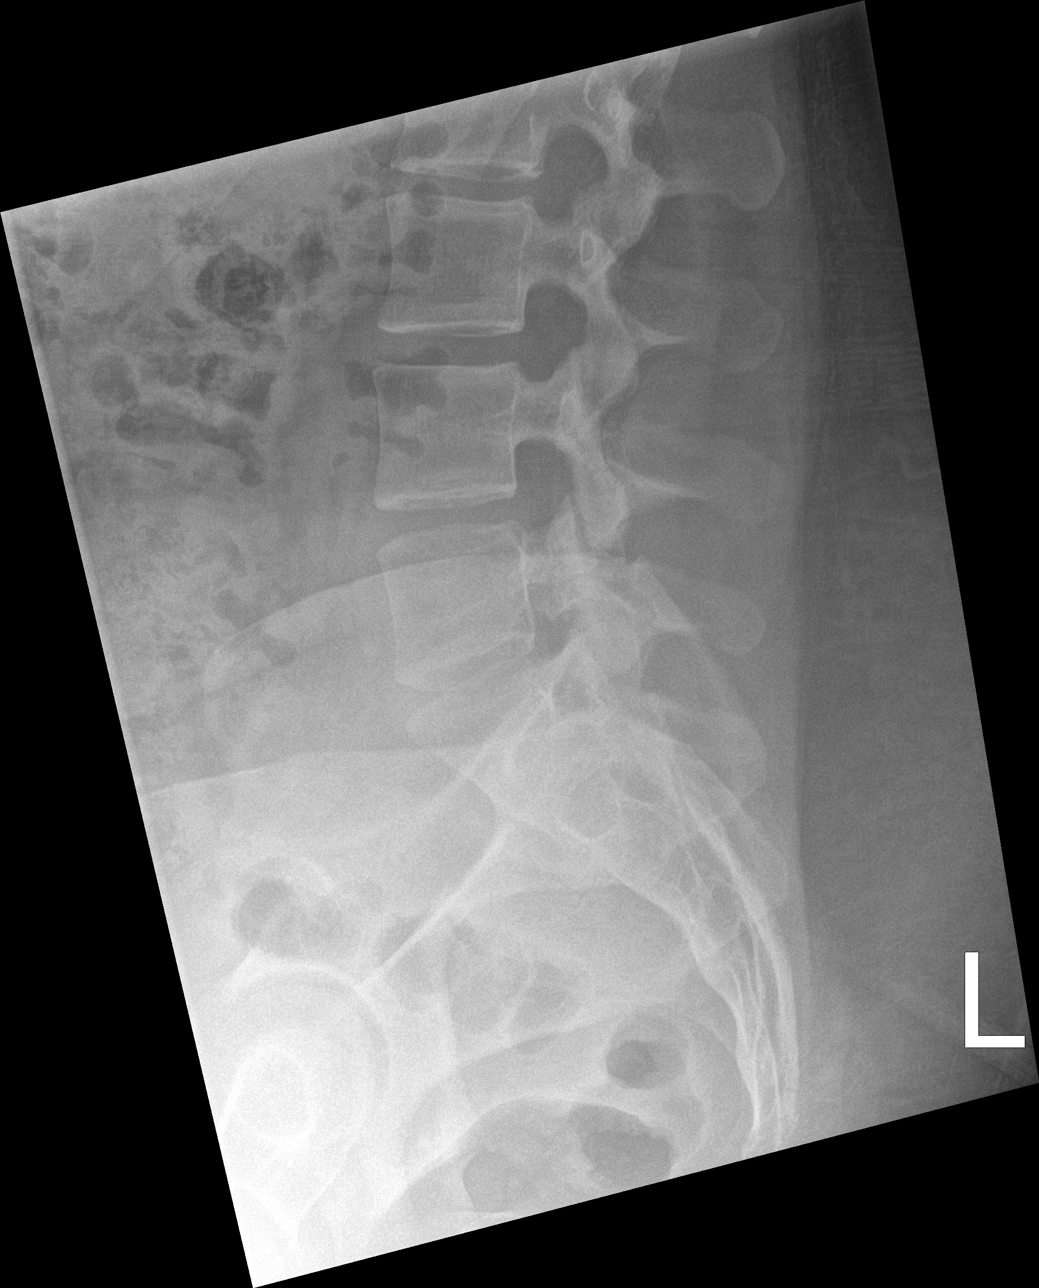

[5 of 5 positions shown; findings below may reference images not displayed]

FINDINGS: The lumbar vertebrae are normal in height. There is no bone lesion
or bony destruction. Sacroiliac joints appear unremarkable. No
fracture is evident.
IMPRESSION: Negative.

## 2016-05-29 ENCOUNTER — Emergency Department (HOSPITAL_BASED_OUTPATIENT_CLINIC_OR_DEPARTMENT_OTHER)
Admission: EM | Admit: 2016-05-29 | Discharge: 2016-05-29 | Disposition: A | Discharge status: Home / Self Care | Payer: Medicaid Other | Attending: Emergency Medicine | Admitting: Emergency Medicine

## 2016-05-29 ENCOUNTER — Encounter (HOSPITAL_BASED_OUTPATIENT_CLINIC_OR_DEPARTMENT_OTHER): Payer: Self-pay | Admitting: *Deleted

## 2016-05-29 DIAGNOSIS — Z87891 Personal history of nicotine dependence: Secondary | ICD-10-CM | POA: Insufficient documentation

## 2016-05-29 DIAGNOSIS — Y999 Unspecified external cause status: Secondary | ICD-10-CM | POA: Diagnosis not present

## 2016-05-29 DIAGNOSIS — Y929 Unspecified place or not applicable: Secondary | ICD-10-CM | POA: Diagnosis not present

## 2016-05-29 DIAGNOSIS — Y939 Activity, unspecified: Secondary | ICD-10-CM | POA: Insufficient documentation

## 2016-05-29 DIAGNOSIS — X58XXXA Exposure to other specified factors, initial encounter: Secondary | ICD-10-CM | POA: Insufficient documentation

## 2016-05-29 DIAGNOSIS — S3992XA Unspecified injury of lower back, initial encounter: Secondary | ICD-10-CM | POA: Diagnosis present

## 2016-05-29 DIAGNOSIS — S39012A Strain of muscle, fascia and tendon of lower back, initial encounter: Secondary | ICD-10-CM | POA: Insufficient documentation

## 2016-05-29 HISTORY — DX: Dorsalgia, unspecified: M54.9

## 2016-05-29 HISTORY — DX: Other chronic pain: G89.29

## 2016-05-29 MED ORDER — KETOROLAC TROMETHAMINE 60 MG/2ML IM SOLN
60.0000 mg | Freq: Once | INTRAMUSCULAR | Status: AC
  Administered 2016-05-29: 60 mg via INTRAMUSCULAR
  Filled 2016-05-29: qty 2

## 2016-05-29 MED ORDER — DEXAMETHASONE SODIUM PHOSPHATE 10 MG/ML IJ SOLN
10.0000 mg | Freq: Once | INTRAMUSCULAR | Status: AC
  Administered 2016-05-29: 10 mg via INTRAMUSCULAR
  Filled 2016-05-29: qty 1

## 2016-05-29 MED ORDER — MELOXICAM 15 MG PO TABS: 15.0000 mg | ORAL_TABLET | Freq: Every day | ORAL | 0 refills | Status: DC

## 2016-05-29 MED ORDER — BACLOFEN 10 MG PO TABS
10.0000 mg | ORAL_TABLET | Freq: Three times a day (TID) | ORAL | 0 refills | Status: DC
Start: 2016-05-29 — End: 2019-02-21

## 2016-05-29 NOTE — ED Notes (Signed)
c/o low back pain x 3 hours  Has not taken any meds,  States was cooking when pain started

## 2016-05-29 NOTE — ED Triage Notes (Signed)
Pt c/o lower back pain with HX of same

## 2016-05-29 NOTE — Discharge Instructions (Signed)
SEEK IMMEDIATE MEDICAL ATTENTION IF: New numbness, tingling, weakness, or problem with the use of your arms or legs.  Severe back pain not relieved with medications.  Change in bowel or bladder control.  Increasing pain in any areas of the body (such as chest or abdominal pain).  Shortness of breath, dizziness or fainting.  Nausea (feeling sick to your stomach), vomiting, fever, or sweats.  

## 2016-05-29 NOTE — ED Provider Notes (Signed)
[facility] Provider Note   CSN: [id number] Arrival date & time: 05/29/16  2122  By signing my name below, I, Rachel Calhoun, attest that this documentation has been prepared under the direction and in the presence of non-physician practitioner, Arthor Captain, PA-C. Electronically Signed: Modena Calhoun, Scribe. 05/29/2016. 11:18 PM.  History   Chief Complaint Chief Complaint  Patient presents with  . Back Pain   The history is provided by the patient. No language interpreter was used.   HPI Comments: Rachel Calhoun is a 21 y.o. female with a PMHx of sciatica who presents to the Emergency Department complaining of intermittent moderate right lower back pain that started a few hours ago. She states her pain started while she was cooking. No known injury. Her pain is exacerbated by movement and relieved by laying flat and icyhot. Her pain radiates to her right anterior thigh and buttock. She has a hx of degenerative disc disorder and her pain is similar to the prior pain she has been having since 2014. Denies any bowel/bladder incontinence or other complaints at this time.    PCP: [facility], FNP  Past Medical History:  Diagnosis Date  . Anxiety   . Chronic back pain   . Gallstones   . Sciatica   . Sciatica     Patient Active Problem List   Diagnosis Date Noted  . Paronychia 12/18/2012    Past Surgical History:  Procedure Laterality Date  . CHOLECYSTECTOMY    . HERNIA REPAIR    . I&D EXTREMITY Right 12/18/2012   Procedure: IRRIGATION AND DEBRIDEMENT RIGHT RING FINGER;  Surgeon: Tami Ribas, MD;  Location: [facility] OR;  Service: Orthopedics;  Laterality: Right;    OB History    Gravida Para Term Preterm AB Living   1             SAB TAB Ectopic Multiple Live Births                   Home Medications    Prior to Admission medications   Medication Sig Start Date End Date Taking? Authorizing Provider  acetaminophen (TYLENOL) 325 MG tablet Take 650 mg by  mouth every 6 (six) hours as needed for mild pain or headache.    Historical Provider, MD  ibuprofen (ADVIL,MOTRIN) 200 MG tablet Take 200 mg by mouth every 6 (six) hours as needed.    Historical Provider, MD  meloxicam (MOBIC) 15 MG tablet Take 1 tablet (15 mg total) by mouth daily. 10/04/14   April Palumbo, MD  omeprazole (PRILOSEC) 20 MG capsule Take 1 capsule (20 mg total) by mouth daily. 02/06/16   Tilden Fossa, MD  ondansetron (ZOFRAN ODT) 4 MG disintegrating tablet Take 1 tablet (4 mg total) by mouth every 8 (eight) hours as needed for nausea or vomiting. 08/24/14   Kristen N Ward, DO  sucralfate (CARAFATE) 1 G tablet Take 1 tablet (1 g total) by mouth 4 (four) times daily -  with meals and at bedtime. 08/24/14   Layla Maw Ward, DO    Family History No family history on file.  Social History Social History  Substance Use Topics  . Smoking status: Former Games developer  . Smokeless tobacco: Not on file  . Alcohol use No     Allergies   Clindamycin/lincomycin and Pb-hyoscy-atropine-scopolamine   Review of Systems Review of Systems  Constitutional: Negative for fever.  Respiratory: Negative for apnea.   Musculoskeletal: Positive for back pain (Lower).  Physical Exam Updated Vital Signs BP 118/83 (BP Location: Left Arm)   Pulse (!) 105   Temp 98.9 F (37.2 C) (Oral)   Resp 20   Ht  (1.575 m)   Wt 230 lb (104.3 kg)   SpO2 100%   BMI 42.07 kg/m   Physical Exam  Constitutional: She appears well-developed and well-nourished. No distress.  HENT:  Head: Normocephalic.  Eyes: Conjunctivae are normal.  Neck: Neck supple.  Cardiovascular: Normal rate and regular rhythm.   Pulmonary/Chest: Effort normal.  Abdominal: Soft.  Musculoskeletal: Normal range of motion.  No midline TTP, TTP in the right lumbar paraspinal and right gluteus. Normal strength bilaterally including plantar flexion and dorsiflexion. Normal patellar reflexes. ROM is limited to pain and spasm.    Neurological: She is alert.  Skin: Skin is warm and dry.  Psychiatric: She has a normal mood and affect.  Nursing note and vitals reviewed.    ED Treatments / Results  DIAGNOSTIC STUDIES: Oxygen Saturation is 100% on RA, normal by my interpretation.    COORDINATION OF CARE: 11:22 PM- Pt advised of plan for treatment and pt agrees.  Labs (all labs ordered are listed, but only abnormal results are displayed) Labs Reviewed - No data to display  EKG  EKG Interpretation None       Radiology No results found.  Procedures Procedures (including critical care time)  Medications Ordered in ED Medications  ketorolac (TORADOL) injection 60 mg (not administered)  dexamethasone (DECADRON) injection 10 mg (not administered)     Initial Impression / Assessment and Plan / ED Course  I have reviewed the triage vital signs and the nursing notes.  Pertinent labs & imaging results that were available during my care of the patient were reviewed by me and considered in my medical decision making (see chart for details).      Patient with back pain.  No neurological deficits and normal neuro exam.  Patient can walk but states is painful.  No loss of bowel or bladder control.  No concern for cauda equina.  No fever, night sweats, weight loss, h/o cancer, IVDU.  RICE protocol and pain medicine indicated and discussed with patient.   Final Clinical Impressions(s) / ED Diagnoses   Final diagnoses:  Strain of lumbar region, initial encounter    New Prescriptions New Prescriptions   No medications on file   I personally performed the services described in this documentation, which was scribed in my presence. The recorded information has been reviewed and is accurate.        Arthor Captain, PA-C 05/29/16 2338    Paula Libra, MD 05/30/16 [phone]

## 2016-12-01 ENCOUNTER — Encounter (HOSPITAL_BASED_OUTPATIENT_CLINIC_OR_DEPARTMENT_OTHER): Payer: Self-pay | Admitting: Emergency Medicine

## 2016-12-01 DIAGNOSIS — Z87891 Personal history of nicotine dependence: Secondary | ICD-10-CM | POA: Insufficient documentation

## 2016-12-01 DIAGNOSIS — Z79899 Other long term (current) drug therapy: Secondary | ICD-10-CM | POA: Insufficient documentation

## 2016-12-01 DIAGNOSIS — M5432 Sciatica, left side: Secondary | ICD-10-CM | POA: Insufficient documentation

## 2016-12-01 DIAGNOSIS — M545 Low back pain: Secondary | ICD-10-CM | POA: Diagnosis present

## 2016-12-01 DIAGNOSIS — G8929 Other chronic pain: Secondary | ICD-10-CM | POA: Diagnosis not present

## 2016-12-01 NOTE — ED Triage Notes (Signed)
C/o sharp shooting lumbar back pain that started today. Hx of degenerative disc per pt.  Denies sx other than pain.

## 2016-12-02 ENCOUNTER — Emergency Department (HOSPITAL_BASED_OUTPATIENT_CLINIC_OR_DEPARTMENT_OTHER)
Admission: EM | Admit: 2016-12-02 | Discharge: 2016-12-02 | Disposition: A | Discharge status: Home / Self Care | Payer: Medicaid Other | Attending: Emergency Medicine | Admitting: Emergency Medicine

## 2016-12-02 DIAGNOSIS — M5432 Sciatica, left side: Secondary | ICD-10-CM

## 2016-12-02 MED ORDER — GABAPENTIN 300 MG PO CAPS
300.0000 mg | ORAL_CAPSULE | Freq: Once | ORAL | Status: AC
  Administered 2016-12-02: 300 mg via ORAL

## 2016-12-02 MED ORDER — GABAPENTIN 100 MG PO CAPS
ORAL_CAPSULE | ORAL | Status: AC
  Filled 2016-12-02: qty 3

## 2016-12-02 MED ORDER — GABAPENTIN 300 MG PO CAPS: 300.0000 mg | ORAL_CAPSULE | Freq: Two times a day (BID) | ORAL | 0 refills | Status: DC

## 2016-12-02 MED ORDER — KETOROLAC TROMETHAMINE 30 MG/ML IJ SOLN
30.0000 mg | Freq: Once | INTRAMUSCULAR | Status: AC
  Administered 2016-12-02: 30 mg via INTRAMUSCULAR
  Filled 2016-12-02: qty 1

## 2016-12-02 MED ORDER — HYDROCODONE-ACETAMINOPHEN 5-325 MG PO TABS
1.0000 | ORAL_TABLET | Freq: Once | ORAL | Status: AC
  Administered 2016-12-02: 1 via ORAL
  Filled 2016-12-02: qty 1

## 2016-12-02 MED ORDER — NAPROXEN 500 MG PO TABS: 500.0000 mg | ORAL_TABLET | Freq: Two times a day (BID) | ORAL | 0 refills | Status: DC

## 2016-12-02 NOTE — ED Provider Notes (Signed)
[facility] EMERGENCY DEPARTMENT Provider Note   CSN: [id number] Arrival date & time: 12/01/16  2158     History   Chief Complaint Chief Complaint  Patient presents with  . Back Pain    HPI Rachel Calhoun is a 21 y.o. female.  HPI  This is a 21 year old female with a history of chronic back pain and sciatica who presents with low back pain.  Patient reports onset of symptoms last night at 10 PM.  She denies any new injury.  She reports that the pain is mostly left lower back radiating in her left leg down to the back of her knee.  She denies numbness or tingling in that leg.  She denies bowel or bladder difficulty.  This is similar to her prior symptoms.  Currently she rates her pain at 10 out of 10.  She has not taken anything for her symptoms.  She has previously had one physical therapy session but otherwise is not on any chronic medications.  Past Medical History:  Diagnosis Date  . Anxiety   . Chronic back pain   . Gallstones   . Sciatica   . Sciatica     Patient Active Problem List   Diagnosis Date Noted  . Paronychia 12/18/2012    Past Surgical History:  Procedure Laterality Date  . CHOLECYSTECTOMY    . HERNIA REPAIR    . I&D EXTREMITY Right 12/18/2012   Procedure: IRRIGATION AND DEBRIDEMENT RIGHT RING FINGER;  Surgeon: Tami RibasKevin R Kuzma, MD;  Location: [facility] OR;  Service: Orthopedics;  Laterality: Right;    OB History    Gravida Para Term Preterm AB Living   1             SAB TAB Ectopic Multiple Live Births                   Home Medications    Prior to Admission medications   Medication Sig Start Date End Date Taking? Authorizing Provider  baclofen (LIORESAL) 10 MG tablet Take 1 tablet (10 mg total) by mouth 3 (three) times daily. 05/29/16  Yes Harris, Abigail, PA-C  acetaminophen (TYLENOL) 325 MG tablet Take 650 mg by mouth every 6 (six) hours as needed for mild pain or headache.    [provider]  gabapentin (NEURONTIN) 300 MG capsule  Take 1 capsule (300 mg total) by mouth 2 (two) times daily. 12/02/16   Horton, Mayer Maskerourtney F, MD  ibuprofen (ADVIL,MOTRIN) 200 MG tablet Take 200 mg by mouth every 6 (six) hours as needed.    [provider]  meloxicam (MOBIC) 15 MG tablet Take 1 tablet (15 mg total) by mouth daily. Take 1 daily with food. 05/29/16   Arthor CaptainHarris, Abigail, PA-C  meloxicam (MOBIC) 15 MG tablet Take 1 tablet (15 mg total) by mouth daily. 05/29/16   Harris, Cammy CopaAbigail, PA-C  naproxen (NAPROSYN) 500 MG tablet Take 1 tablet (500 mg total) by mouth 2 (two) times daily. 12/02/16   Horton, Mayer Maskerourtney F, MD  omeprazole (PRILOSEC) 20 MG capsule Take 1 capsule (20 mg total) by mouth daily. 02/06/16   Tilden Fossaees, Elizabeth, MD  ondansetron (ZOFRAN ODT) 4 MG disintegrating tablet Take 1 tablet (4 mg total) by mouth every 8 (eight) hours as needed for nausea or vomiting. 08/24/14   Ward, Layla MawKristen N, DO  sucralfate (CARAFATE) 1 G tablet Take 1 tablet (1 g total) by mouth 4 (four) times daily -  with meals and at bedtime. 08/24/14   Ward, Baxter HireKristen  N, DO    Family History No family history on file.  Social History Social History  Substance Use Topics  . Smoking status: Former Games developer  . Smokeless tobacco: Never Used  . Alcohol use No     Allergies   Belladonna alkaloids; Clindamycin/lincomycin; and Pb-hyoscy-atropine-scopolamine   Review of Systems Review of Systems  Constitutional: Negative for fever.  Genitourinary: Negative for difficulty urinating.  Musculoskeletal: Positive for back pain.  Neurological: Negative for weakness and numbness.  All other systems reviewed and are negative.    Physical Exam Updated Vital Signs BP 129/69   Pulse 83   Temp 98.3 F (36.8 C) (Oral)   Resp 20   Ht 5\' 2"  (1.575 m)   Wt 104.3 kg (230 lb)   SpO2 97%   BMI 42.07 kg/m   Physical Exam  Constitutional: She is oriented to person, place, and time. She appears well-developed and well-nourished. No distress.  Obese  HENT:  Head:  Normocephalic and atraumatic.  Cardiovascular: Normal rate, regular rhythm and normal heart sounds.   Pulmonary/Chest: Effort normal and breath sounds normal. No respiratory distress. She has no wheezes.  Abdominal: Soft. There is no tenderness.  Musculoskeletal:  Tenderness to palpation lower lumbar spine without step-off or deformity, positive straight leg raise, positive cross leg raise  Neurological: She is alert and oriented to person, place, and time.  5 out of 5 strength with plantar and dorsiflexion and hip flexion bilaterally, no clonus, normal reflexes bilaterally  Skin: Skin is warm and dry.  Psychiatric: She has a normal mood and affect.  Nursing note and vitals reviewed.    ED Treatments / Results  Labs (all labs ordered are listed, but only abnormal results are displayed) Labs Reviewed - No data to display  EKG  EKG Interpretation None       Radiology No results found.  Procedures Procedures (including critical care time)  Medications Ordered in ED Medications  ketorolac (TORADOL) 30 MG/ML injection 30 mg (30 mg Intramuscular Given 12/02/16 0140)  gabapentin (NEURONTIN) capsule 300 mg (300 mg Oral Given 12/02/16 0139)  HYDROcodone-acetaminophen (NORCO/VICODIN) 5-325 MG per tablet 1 tablet (1 tablet Oral Given 12/02/16 0139)     Initial Impression / Assessment and Plan / ED Course  I have reviewed the triage vital signs and the nursing notes.  Pertinent labs & imaging results that were available during my care of the patient were reviewed by me and considered in my medical decision making (see chart for details).     Patient presents with acute on chronic back pain.  No red flags or signs or symptoms of cauda equina.  History of sciatica with similar symptoms.  Exam is consistent with sciatica.  Patient was given Toradol and gabapentin.  Will discharge home with naproxen and gabapentin.  Primary care follow-up and physical therapy recommended.  After  history, exam, and medical workup I feel the patient has been appropriately medically screened and is safe for discharge home. Pertinent diagnoses were discussed with the patient. Patient was given return precautions.   Final Clinical Impressions(s) / ED Diagnoses   Final diagnoses:  Sciatica of left side    New Prescriptions New Prescriptions   GABAPENTIN (NEURONTIN) 300 MG CAPSULE    Take 1 capsule (300 mg total) by mouth 2 (two) times daily.   NAPROXEN (NAPROSYN) 500 MG TABLET    Take 1 tablet (500 mg total) by mouth 2 (two) times daily.     Shon Baton, MD 12/02/16  0331  

## 2016-12-02 NOTE — ED Notes (Signed)
ED Provider at bedside. 

## 2017-03-26 ENCOUNTER — Emergency Department (HOSPITAL_BASED_OUTPATIENT_CLINIC_OR_DEPARTMENT_OTHER)
Admission: EM | Admit: 2017-03-26 | Discharge: 2017-03-26 | Disposition: A | Discharge status: Home / Self Care | Payer: Medicaid Other | Attending: Emergency Medicine | Admitting: Emergency Medicine

## 2017-03-26 ENCOUNTER — Encounter (HOSPITAL_BASED_OUTPATIENT_CLINIC_OR_DEPARTMENT_OTHER): Payer: Self-pay | Admitting: Emergency Medicine

## 2017-03-26 ENCOUNTER — Other Ambulatory Visit: Payer: Self-pay

## 2017-03-26 DIAGNOSIS — Z87891 Personal history of nicotine dependence: Secondary | ICD-10-CM | POA: Insufficient documentation

## 2017-03-26 DIAGNOSIS — Z711 Person with feared health complaint in whom no diagnosis is made: Secondary | ICD-10-CM | POA: Insufficient documentation

## 2017-03-26 DIAGNOSIS — Z418 Encounter for other procedures for purposes other than remedying health state: Secondary | ICD-10-CM

## 2017-03-26 MED ORDER — AMOXICILLIN-POT CLAVULANATE 875-125 MG PO TABS: 1.0000 | ORAL_TABLET | Freq: Two times a day (BID) | ORAL | 0 refills | Status: AC

## 2017-03-26 NOTE — ED Provider Notes (Signed)
[facility] EMERGENCY DEPARTMENT Provider Note   CSN: [id number] Arrival date & time: 03/26/17  1914     History   Chief Complaint Chief Complaint  Patient presents with  . Oral Swelling    HPI Rachel Calhoun is a 22 y.o. female with history of anxiety who presents for concern for infection after tongue piercing performed 1 week ago.   HPI  Patient reports increased swelling and slight drainage when cleaning around the ball of her new tongue ring the last couple of days. She says bar initially placed seemed too small once swelling increased, so she went back to the person who did the piercing and had bar exchanged for a longer one. She has been doing salt water gargles and taking tylenol as needed (last around 11 pm). Denies fevers or chills. Says she has some tingling at the tip of her tongue and discomfort with eating. Denies shortness of breath or trouble swallowing. She brings q-tips from recent cleanings that have scant yellowish mucus. She says she had a lip piercing before that got infected, so she kept it removed.   Past Medical History:  Diagnosis Date  . Anxiety   . Chronic back pain   . Gallstones   . Sciatica   . Sciatica     Patient Active Problem List   Diagnosis Date Noted  . Paronychia 12/18/2012    Past Surgical History:  Procedure Laterality Date  . CHOLECYSTECTOMY    . HERNIA REPAIR    . I&D EXTREMITY Right 12/18/2012   Procedure: IRRIGATION AND DEBRIDEMENT RIGHT RING FINGER;  Surgeon: Tami Ribas, MD;  Location: [facility] OR;  Service: Orthopedics;  Laterality: Right;    OB History    Gravida Para Term Preterm AB Living   1             SAB TAB Ectopic Multiple Live Births                   Home Medications    Prior to Admission medications   Medication Sig Start Date End Date Taking? Authorizing Provider  acetaminophen (TYLENOL) 325 MG tablet Take 650 mg by mouth every 6 (six) hours as needed for mild pain or headache.    [provider]  amoxicillin-clavulanate (AUGMENTIN) 875-125 MG tablet Take 1 tablet by mouth every 12 (twelve) hours for 7 days. 03/26/17 04/02/17  Alvira Monday, MD  baclofen (LIORESAL) 10 MG tablet Take 1 tablet (10 mg total) by mouth 3 (three) times daily. 05/29/16   Arthor Captain, PA-C  gabapentin (NEURONTIN) 300 MG capsule Take 1 capsule (300 mg total) by mouth 2 (two) times daily. 12/02/16   Horton, Mayer Masker, MD  ibuprofen (ADVIL,MOTRIN) 200 MG tablet Take 200 mg by mouth every 6 (six) hours as needed.    [provider]  meloxicam (MOBIC) 15 MG tablet Take 1 tablet (15 mg total) by mouth daily. Take 1 daily with food. 05/29/16   Arthor Captain, PA-C  meloxicam (MOBIC) 15 MG tablet Take 1 tablet (15 mg total) by mouth daily. 05/29/16   Harris, Cammy Copa, PA-C  naproxen (NAPROSYN) 500 MG tablet Take 1 tablet (500 mg total) by mouth 2 (two) times daily. 12/02/16   Horton, Mayer Masker, MD  omeprazole (PRILOSEC) 20 MG capsule Take 1 capsule (20 mg total) by mouth daily. 02/06/16   Tilden Fossa, MD  ondansetron (ZOFRAN ODT) 4 MG disintegrating tablet Take 1 tablet (4 mg total) by mouth every 8 (eight) hours  as needed for nausea or vomiting. 08/24/14   Ward, Layla Maw, DO  sucralfate (CARAFATE) 1 G tablet Take 1 tablet (1 g total) by mouth 4 (four) times daily -  with meals and at bedtime. 08/24/14   Ward, Layla Maw, DO    Family History History reviewed. No pertinent family history.  Social History Social History   Tobacco Use  . Smoking status: Former Games developer  . Smokeless tobacco: Never Used  Substance Use Topics  . Alcohol use: No  . Drug use: No     Allergies   Belladonna alkaloids; Clindamycin/lincomycin; and Pb-hyoscy-atropine-scopolamine   Review of Systems Review of Systems  Constitutional: Negative for appetite change, chills, fatigue and fever.  HENT: Positive for congestion. Negative for rhinorrhea, sore throat and trouble swallowing.   Eyes: Negative for  redness.  Respiratory: Negative for cough, shortness of breath and wheezing.   Cardiovascular: Negative for chest pain.  Gastrointestinal: Negative for abdominal pain.  Genitourinary: Negative for dysuria.  Musculoskeletal: Negative for myalgias and neck pain.  Skin: Negative for rash.  Neurological: Negative for dizziness and weakness.     Physical Exam Updated Vital Signs BP 111/71 (BP Location: Left Arm)   Pulse 81   Temp 98.2 F (36.8 C) (Oral)   Resp 18   Ht 5\' 2"  (1.575 m)   Wt 102.5 kg (226 lb)   SpO2 100%   BMI 41.34 kg/m   Physical Exam  Constitutional: She is oriented to person, place, and time. She appears well-developed and well-nourished. No distress.  Sitting in bed, making telephone calls.  HENT:  Head: Normocephalic and atraumatic.  Right Ear: External ear normal.  Left Ear: External ear normal.  Mouth/Throat: Oropharynx is clear and moist.  Tongue piercing in place. Anterior aspect of tongue in front of piercing mildly TTP. Cannot elicit discharge from tongue with pressure. Tongue appears grossly normal.  Eyes: Conjunctivae and EOM are normal. Pupils are equal, round, and reactive to light.  Neck: Normal range of motion. Neck supple.  Cardiovascular: Normal rate and regular rhythm.  No murmur heard. Pulmonary/Chest: Effort normal and breath sounds normal. No respiratory distress. She has no wheezes.  Abdominal: Soft. Bowel sounds are normal. There is no tenderness.  Musculoskeletal: Normal range of motion.  Lymphadenopathy:    She has no cervical adenopathy.  Neurological: She is alert and oriented to person, place, and time.  Skin: Skin is warm. No rash noted.  Psychiatric: She has a normal mood and affect.  Nursing note and vitals reviewed.    ED Treatments / Results  Labs (all labs ordered are listed, but only abnormal results are displayed) Labs Reviewed - No data to display  EKG  EKG Interpretation None       Radiology No results  found.  Procedures Procedures (including critical care time)  Medications Ordered in ED Medications - No data to display   Initial Impression / Assessment and Plan / ED Course  I have reviewed the triage vital signs and the nursing notes.  Pertinent labs & imaging results that were available during my care of the patient were reviewed by me and considered in my medical decision making (see chart for details).  Patient without fever and tongue appears grossly normal. No drainage noted from tongue.   Final Clinical Impressions(s) / ED Diagnoses   Final diagnoses:  Encounter for piercing of tongue   Patient advised to continue routine care of recent piercing. Prescription for augmentin given in case patient develops fever  or has increased pain/swelling of tongue in next 48 hours. If this occurs, she was advised to remove piercing. Strict return precautions provided.  ED Discharge Orders        Ordered    amoxicillin-clavulanate (AUGMENTIN) 875-125 MG tablet  Every 12 hours     03/26/17 1025       Dani GobbleFitzgerald, Kinsie Belford [place] City[place], MD 03/26/17 1735    Alvira MondaySchlossman, Erin, MD 03/29/17 [phone]

## 2017-03-26 NOTE — Discharge Instructions (Addendum)
At this time you appear to have normal healing process of recent tongue piercing.  However, if you have any increasing pain or swelling in the next 48 hours, would initiate this prescription for antibiotics and remove your tongue piercing and follow up closely with your primary care physician.  If you have severe swelling, pain, difficulty breathing, difficulty swallowing, fevers or other concerns return to the ED.

## 2017-03-26 NOTE — ED Triage Notes (Signed)
Patient states that she had her tongue pierced about 1 week ago - she has had drainage from it over the last 2 -3 days

## 2017-04-26 ENCOUNTER — Emergency Department (HOSPITAL_BASED_OUTPATIENT_CLINIC_OR_DEPARTMENT_OTHER): Payer: Self-pay

## 2017-04-26 ENCOUNTER — Emergency Department (HOSPITAL_BASED_OUTPATIENT_CLINIC_OR_DEPARTMENT_OTHER)
Admission: EM | Admit: 2017-04-26 | Discharge: 2017-04-26 | Disposition: A | Discharge status: Home / Self Care | Payer: Self-pay | Attending: Emergency Medicine | Admitting: Emergency Medicine

## 2017-04-26 ENCOUNTER — Encounter (HOSPITAL_BASED_OUTPATIENT_CLINIC_OR_DEPARTMENT_OTHER): Payer: Self-pay | Admitting: Emergency Medicine

## 2017-04-26 ENCOUNTER — Other Ambulatory Visit: Payer: Self-pay

## 2017-04-26 DIAGNOSIS — R197 Diarrhea, unspecified: Secondary | ICD-10-CM | POA: Insufficient documentation

## 2017-04-26 DIAGNOSIS — E860 Dehydration: Secondary | ICD-10-CM | POA: Insufficient documentation

## 2017-04-26 DIAGNOSIS — Z87891 Personal history of nicotine dependence: Secondary | ICD-10-CM | POA: Insufficient documentation

## 2017-04-26 DIAGNOSIS — R112 Nausea with vomiting, unspecified: Secondary | ICD-10-CM | POA: Insufficient documentation

## 2017-04-26 DIAGNOSIS — R072 Precordial pain: Secondary | ICD-10-CM | POA: Insufficient documentation

## 2017-04-26 DIAGNOSIS — M791 Myalgia, unspecified site: Secondary | ICD-10-CM | POA: Insufficient documentation

## 2017-04-26 LAB — CBC WITH DIFFERENTIAL/PLATELET
Basophils Absolute: 0 10*3/uL (ref 0.0–0.1)
Basophils Relative: 0 %
EOS ABS: 0.2 10*3/uL (ref 0.0–0.7)
EOS PCT: 1 %
HCT: 47.3 % — ABNORMAL HIGH (ref 36.0–46.0)
HEMOGLOBIN: 16.2 g/dL — AB (ref 12.0–15.0)
LYMPHS ABS: 0.5 10*3/uL — AB (ref 0.7–4.0)
Lymphocytes Relative: 4 %
MCH: 30.1 pg (ref 26.0–34.0)
MCHC: 34.2 g/dL (ref 30.0–36.0)
MCV: 87.8 fL (ref 78.0–100.0)
MONO ABS: 0.8 10*3/uL (ref 0.1–1.0)
MONOS PCT: 6 %
NEUTROS PCT: 89 %
Neutro Abs: 11.1 10*3/uL — ABNORMAL HIGH (ref 1.7–7.7)
PLATELETS: 247 10*3/uL (ref 150–400)
RBC: 5.39 MIL/uL — ABNORMAL HIGH (ref 3.87–5.11)
RDW: 13.2 % (ref 11.5–15.5)
WBC: 12.6 10*3/uL — ABNORMAL HIGH (ref 4.0–10.5)

## 2017-04-26 LAB — COMPREHENSIVE METABOLIC PANEL
ALK PHOS: 69 U/L (ref 38–126)
ALT: 26 U/L (ref 14–54)
ANION GAP: 10 (ref 5–15)
AST: 19 U/L (ref 15–41)
Albumin: 4.5 g/dL (ref 3.5–5.0)
BILIRUBIN TOTAL: 1.8 mg/dL — AB (ref 0.3–1.2)
BUN: 14 mg/dL (ref 6–20)
CALCIUM: 9.2 mg/dL (ref 8.9–10.3)
CO2: 22 mmol/L (ref 22–32)
CREATININE: 0.79 mg/dL (ref 0.44–1.00)
Chloride: 105 mmol/L (ref 101–111)
GFR calc Af Amer: >60 mL/min (ref >60)
GFR calc non Af Amer: >60 mL/min (ref >60)
GLUCOSE: 105 mg/dL — AB (ref 65–99)
Potassium: 4 mmol/L (ref 3.5–5.1)
Sodium: 137 mmol/L (ref 135–145)
TOTAL PROTEIN: 7.8 g/dL (ref 6.5–8.1)

## 2017-04-26 LAB — LIPASE, BLOOD: Lipase: 21 U/L (ref 11–51)

## 2017-04-26 LAB — PREGNANCY, URINE: PREG TEST UR: NEGATIVE

## 2017-04-26 LAB — TROPONIN I: Troponin I: <0.03 ng/mL (ref <0.03)

## 2017-04-26 MED ORDER — DICYCLOMINE HCL 20 MG PO TABS: 20.0000 mg | ORAL_TABLET | Freq: Two times a day (BID) | ORAL | 0 refills | Status: DC

## 2017-04-26 MED ORDER — LOPERAMIDE HCL 2 MG PO CAPS: 2.0000 mg | ORAL_CAPSULE | Freq: Four times a day (QID) | ORAL | 0 refills | Status: DC | PRN

## 2017-04-26 MED ORDER — ONDANSETRON HCL 4 MG/2ML IJ SOLN
4.0000 mg | Freq: Once | INTRAMUSCULAR | Status: AC
Start: 2017-04-26 — End: 2017-04-26
  Administered 2017-04-26: 4 mg via INTRAVENOUS
  Filled 2017-04-26: qty 2

## 2017-04-26 MED ORDER — SODIUM CHLORIDE 0.9 % IV BOLUS (SEPSIS)
1000.0000 mL | Freq: Once | INTRAVENOUS | Status: AC
  Administered 2017-04-26: 1000 mL via INTRAVENOUS

## 2017-04-26 MED ORDER — ONDANSETRON 4 MG PO TBDP: 4.0000 mg | ORAL_TABLET | Freq: Three times a day (TID) | ORAL | 0 refills | Status: DC | PRN

## 2017-04-26 MED ORDER — MORPHINE SULFATE (PF) 4 MG/ML IV SOLN
4.0000 mg | Freq: Once | INTRAVENOUS | Status: AC
  Administered 2017-04-26: 4 mg via INTRAVENOUS
  Filled 2017-04-26: qty 1

## 2017-04-26 MED ORDER — SUCRALFATE 1 GM/10ML PO SUSP: 1.0000 g | Freq: Three times a day (TID) | ORAL | 0 refills | Status: DC

## 2017-04-26 MED ORDER — DICYCLOMINE HCL 10 MG PO CAPS
10.0000 mg | ORAL_CAPSULE | Freq: Once | ORAL | Status: AC
  Administered 2017-04-26: 10 mg via ORAL
  Filled 2017-04-26: qty 1

## 2017-04-26 MED ORDER — IOPAMIDOL (ISOVUE-370) INJECTION 76%
100.0000 mL | Freq: Once | INTRAVENOUS | Status: AC | PRN
  Administered 2017-04-26: 100 mL via INTRAVENOUS

## 2017-04-26 NOTE — ED Provider Notes (Signed)
Emergency Department Provider Note   I have reviewed the triage vital signs and the nursing notes.   HISTORY  Chief Complaint Emesis and Generalized Body Aches   HPI Rachel Calhoun is a 22 y.o. female with PMH of anxiety presents to the emergency department for evaluation of nausea, vomiting, diarrhea, body aches starting abruptly this morning at 11 AM.  She had some mild nausea yesterday that seemed to resolve.  She denies severe abdominal pain or back pain but is have some diffuse, intermittent discomfort.  No vaginal bleeding or discharge.  No radiation of symptoms.  She has not been able to eat or drink food or fluids without vomiting.  She also notes some exertional dyspnea which started today.  She is not having any chest pain or productive cough.  No similar symptoms in the past.   Past Medical History:  Diagnosis Date  . Anxiety   . Chronic back pain   . Gallstones   . Sciatica   . Sciatica     Patient Active Problem List   Diagnosis Date Noted  . Paronychia 12/18/2012    Past Surgical History:  Procedure Laterality Date  . CHOLECYSTECTOMY    . HERNIA REPAIR    . I&D EXTREMITY Right 12/18/2012   Procedure: IRRIGATION AND DEBRIDEMENT RIGHT RING FINGER;  Surgeon: Tami Ribas, MD;  Location: [facility] OR;  Service: Orthopedics;  Laterality: Right;    Current Outpatient Rx  . Order #: [id number] Class: Historical Med  . Order #: [id number] Class: Print  . Order #: [id number] Class: Print  . Order #: [id number] Class: Print  . Order #: [id number] Class: Historical Med  . Order #: [id number] Class: Print  . Order #: [id number] Class: Print  . Order #: [id number] Class: Print  . Order #: [id number] Class: Print  . Order #: [id number] Class: Print  . Order #: [id number] Class: Print  . Order #: [id number] Class: Print    Allergies Belladonna alkaloids; Clindamycin/lincomycin; and Pb-hyoscy-atropine-scopolamine  No family history on file.  Social History Social History   Tobacco Use    . Smoking status: Former Games developer  . Smokeless tobacco: Never Used  Substance Use Topics  . Alcohol use: No  . Drug use: No    Review of Systems  Constitutional: No fever/chills. Positive generalized weakness.  Eyes: No visual changes. ENT: No sore throat. Cardiovascular: Positive chest pain (developed since arrival in the ED).  Respiratory: Positive shortness of breath with exertion.  Gastrointestinal: Positive diffuse abdominal pain. Positive nausea, vomiting, and diarrhea.  No constipation. Genitourinary: Negative for dysuria. Musculoskeletal: Positive for back pain (developed since arrival in the ED).  Skin: Negative for rash. Neurological: Negative for focal weakness or numbness. Positive mild HA.   10-point ROS otherwise negative.  ____________________________________________   PHYSICAL EXAM:  VITAL SIGNS: ED Triage Vitals [04/26/17 1748]  Enc Vitals Group     BP 110/79     Pulse Rate (!) 127     Resp 20     Temp 99 F (37.2 C)     Temp Source Oral     SpO2 98 %     Weight 220 lb (99.8 kg)     Height 5\' 2"  (1.575 m)     Pain Score 7   Constitutional: Alert and oriented. Appears slightly uncomfortable but no acute distress.  Eyes: Conjunctivae are normal.  Head: Atraumatic. Nose: No congestion/rhinnorhea. Mouth/Throat: Mucous membranes are dry. Neck: No stridor.   Cardiovascular: Tachycardia. Good peripheral circulation. Grossly normal heart sounds.   Respiratory: Normal respiratory effort.  No retractions. Lungs CTAB. Gastrointestinal: Soft with mild diffuse tenderness. No focal tenderness, rebound, or guarding. No distention.  Musculoskeletal: No lower extremity tenderness nor edema. No gross deformities of extremities. Neurologic:  Normal speech and language. No gross focal neurologic deficits are appreciated.  Skin:  Skin is warm, dry and intact. No rash noted.  ____________________________________________   LABS (all labs ordered are listed, but only  abnormal results are displayed)  Labs Reviewed  COMPREHENSIVE METABOLIC PANEL - Abnormal; Notable for the following components:      Result Value   Glucose, Bld 105 (*)    Total Bilirubin 1.8 (*)    All other components within normal limits  CBC WITH DIFFERENTIAL/PLATELET - Abnormal; Notable for the following components:   WBC 12.6 (*)    RBC 5.39 (*)    Hemoglobin 16.2 (*)    HCT 47.3 (*)    Neutro Abs 11.1 (*)    Lymphs Abs 0.5 (*)    All other components within normal limits  LIPASE, BLOOD  PREGNANCY, URINE  TROPONIN I   ____________________________________________  EKG   EKG Interpretation  Date/Time:  Sunday April 26 2017 18:21:40 EDT Ventricular Rate:  106 PR Interval:    QRS Duration: 80 QT Interval:  327 QTC Calculation: 435 R Axis:   86 Text Interpretation:  Sinus tachycardia Baseline wander in lead(s) V6 No STEMI.  Similar to prior.  Confirmed by Alona BeneLong, Joshua [phone] on 04/26/2017 6:50:45 PM Also confirmed by Alona BeneLong, Joshua [phone]), editor Elita QuickWatlington, [place] [phone])  on 04/27/2017 7:34:31 AM       ____________________________________________  RADIOLOGY  Dg Chest 2 View  Result Date: 04/26/2017 CLINICAL DATA:  Fatigue EXAM: CHEST - 2 VIEW COMPARISON:  09/07/2014 FINDINGS: The heart size and mediastinal contours are within normal limits. Both lungs are clear. The visualized skeletal structures are unremarkable. IMPRESSION: No active cardiopulmonary disease. Electronically Signed   By: Alcide CleverMark  Lukens M.D.   On: 04/26/2017 18:52   Ct Angio Chest Pe W And/or Wo Contrast  Result Date: 04/26/2017 CLINICAL DATA:  Left-sided chest pain radiating from sternum with shortness-of-breath. Patient has nexplanon implant. EXAM: CT ANGIOGRAPHY CHEST WITH CONTRAST TECHNIQUE: Multidetector CT imaging of the chest was performed using the standard protocol during bolus administration of intravenous contrast. Multiplanar CT image reconstructions and MIPs were obtained to evaluate the  vascular anatomy. CONTRAST:  100mL ISOVUE-370 IOPAMIDOL (ISOVUE-370) INJECTION 76% COMPARISON:  None. FINDINGS: Cardiovascular: Heart is normal size. Thoracic aorta is within normal. Pulmonary arterial system is well opacified without emboli. Mediastinum/Nodes: No mediastinal or hilar adenopathy. Lungs/Pleura: Lungs are well inflated without focal airspace consolidation or effusion. Airways are normal. Upper Abdomen: Previous cholecystectomy. Possible punctate stone over the upper pole left kidney. Musculoskeletal: Within normal. Review of the MIP images confirms the above findings. IMPRESSION: No acute cardiopulmonary disease and no evidence of pulmonary embolism. Possible punctate stone over the upper pole left kidney. Electronically Signed   By: Elberta Fortisaniel  Boyle M.D.   On: 04/26/2017 21:35    ____________________________________________   PROCEDURES  Procedure(s) performed:   Procedures  None ____________________________________________   INITIAL IMPRESSION / ASSESSMENT AND PLAN / ED COURSE  Pertinent labs & imaging results that were available during my care of the patient were reviewed by me and considered in my medical decision making (see chart for details).  Patient presents to the emergency department with nausea, vomiting, diarrhea which began abruptly today.  She has some associated body aches and mild exertional dyspnea.  Exam is largely unremarkable.  Labs are pending.  Will obtain chest x-ray with dyspnea symptoms.  Doubt aspiration pneumonia.  No other clinical findings or history features to suggest PE.  EKG similar to prior.   Labs and CXR reviewed with no acute findings. Patient with leukocytosis and slight elevated T. Bili which would be expected with acute onset vomiting and diarrhea episode. Patient now complaining of more predominant chest pain and seems more uncomfortable that during my initial evaluation. With persistent tachycardia a CT angio of the chest was obtained  which shows No PE. Patient is asking specifically for dilaudid which I refused. Says that she has had this in the past for pain and it has worked well. Mom at bedside advocating for Dilaudid as well. Abdomen remains soft and non-tender. She feels thirsty and is requesting something to drink.   Patient tolerating PO. High index of suspicion for acute GI illness with frequent abdominal cramping. Plan for supportive care at home with Bentyl, Zofran, and Immodium. Advised close PCP follow up early this week and strict ED return precautions.   I have reviewed and discussed all results (EKG, imaging, lab, urine as appropriate), exam findings with patient. I have reviewed nursing notes and appropriate previous records.  I feel the patient is safe to be discharged home without further emergent workup. Discussed usual and customary return precautions. Patient and family (if present) verbalize understanding and are comfortable with this plan.  Patient will follow-up with their primary care provider. If they do not have a primary care provider, information for follow-up has been provided to them. All questions have been answered.  ____________________________________________  FINAL CLINICAL IMPRESSION(S) / ED DIAGNOSES  Final diagnoses:  Nausea vomiting and diarrhea  Precordial chest pain  Dehydration     MEDICATIONS GIVEN DURING THIS VISIT:  Medications  sodium chloride 0.9 % bolus 1,000 mL (0 mLs Intravenous Stopped 04/26/17 1913)  ondansetron (ZOFRAN) injection 4 mg (4 mg Intravenous Given 04/26/17 1821)  morphine 4 MG/ML injection 4 mg (4 mg Intravenous Given 04/26/17 2004)  sodium chloride 0.9 % bolus 1,000 mL (0 mLs Intravenous Stopped 04/26/17 2219)  iopamidol (ISOVUE-370) 76 % injection 100 mL (100 mLs Intravenous Contrast Given 04/26/17 2037)  dicyclomine (BENTYL) capsule 10 mg (10 mg Oral Given 04/26/17 2219)     NEW OUTPATIENT MEDICATIONS STARTED DURING THIS VISIT:  Discharge Medication List  as of 04/26/2017 10:58 PM    START taking these medications   Details  dicyclomine (BENTYL) 20 MG tablet Take 1 tablet (20 mg total) by mouth 2 (two) times daily., Starting Sun 04/26/2017, Print    loperamide (IMODIUM) 2 MG capsule Take 1 capsule (2 mg total) by mouth 4 (four) times daily as needed for diarrhea or loose stools., Starting Sun 04/26/2017, Print    sucralfate (CARAFATE) 1 GM/10ML suspension Take 10 mLs (1 g total) by mouth 4 (four) times daily -  with meals and at bedtime., Starting Sun 04/26/2017, Print        Note:  This document was prepared using Dragon voice recognition software and may include unintentional dictation errors.  Alona Bene, MD Emergency Medicine    Long, Arlyss Repress, MD 04/27/17 1101

## 2017-04-26 NOTE — Discharge Instructions (Signed)

## 2017-04-26 NOTE — ED Notes (Signed)
Patient tearful and writhing in pain; patient yelling at her mother who is at bedside. Patient awaiting CT chest. Patient screaming "now its like my gallbladder".

## 2017-04-26 NOTE — ED Notes (Signed)
ED Provider at bedside. 

## 2017-04-26 NOTE — ED Triage Notes (Signed)
N/V/D, generalized body aches since this morning. Son has had similar symptoms.

## 2017-04-26 NOTE — ED Notes (Signed)
Gave patient a glass of ice water for PO challenge.

## 2017-04-26 NOTE — ED Notes (Signed)
Patient crying and complaining of mid upper chest pain and upper back pain; denies nausea at this time. Patient very tearful. Notified EDP of patient's pain.

## 2017-04-26 NOTE — ED Notes (Signed)
Patient medicated for pain; patient's mother at bedside; patient very tearful and anxious and hyperventilating; encouraged patient to take slow deep breaths.

## 2017-04-28 ENCOUNTER — Emergency Department (HOSPITAL_BASED_OUTPATIENT_CLINIC_OR_DEPARTMENT_OTHER)
Admission: EM | Admit: 2017-04-28 | Discharge: 2017-04-28 | Disposition: A | Discharge status: Home / Self Care | Payer: Medicaid Other | Attending: Emergency Medicine | Admitting: Emergency Medicine

## 2017-04-28 ENCOUNTER — Emergency Department (HOSPITAL_BASED_OUTPATIENT_CLINIC_OR_DEPARTMENT_OTHER): Payer: Medicaid Other

## 2017-04-28 ENCOUNTER — Other Ambulatory Visit: Payer: Self-pay

## 2017-04-28 ENCOUNTER — Encounter (HOSPITAL_BASED_OUTPATIENT_CLINIC_OR_DEPARTMENT_OTHER): Payer: Self-pay | Admitting: Emergency Medicine

## 2017-04-28 DIAGNOSIS — Y929 Unspecified place or not applicable: Secondary | ICD-10-CM | POA: Insufficient documentation

## 2017-04-28 DIAGNOSIS — S60943A Unspecified superficial injury of left middle finger, initial encounter: Secondary | ICD-10-CM | POA: Insufficient documentation

## 2017-04-28 DIAGNOSIS — Y33XXXA Other specified events, undetermined intent, initial encounter: Secondary | ICD-10-CM | POA: Insufficient documentation

## 2017-04-28 DIAGNOSIS — R52 Pain, unspecified: Secondary | ICD-10-CM

## 2017-04-28 DIAGNOSIS — Y93E9 Activity, other interior property and clothing maintenance: Secondary | ICD-10-CM | POA: Insufficient documentation

## 2017-04-28 DIAGNOSIS — Z79899 Other long term (current) drug therapy: Secondary | ICD-10-CM | POA: Insufficient documentation

## 2017-04-28 DIAGNOSIS — Y998 Other external cause status: Secondary | ICD-10-CM | POA: Insufficient documentation

## 2017-04-28 DIAGNOSIS — Z87891 Personal history of nicotine dependence: Secondary | ICD-10-CM | POA: Insufficient documentation

## 2017-04-28 DIAGNOSIS — S6992XA Unspecified injury of left wrist, hand and finger(s), initial encounter: Secondary | ICD-10-CM

## 2017-04-28 MED ORDER — BACITRACIN ZINC 500 UNIT/GM EX OINT
TOPICAL_OINTMENT | Freq: Once | CUTANEOUS | Status: AC
  Administered 2017-04-28: 19:00:00 via TOPICAL
  Filled 2017-04-28: qty 28.35

## 2017-04-28 MED ORDER — BUPIVACAINE HCL 0.25 % IJ SOLN
10.0000 mL | Freq: Once | INTRAMUSCULAR | Status: AC
  Administered 2017-04-28: 10 mL
  Filled 2017-04-28: qty 1

## 2017-04-28 MED ORDER — ACETAMINOPHEN 325 MG PO TABS
650.0000 mg | ORAL_TABLET | Freq: Once | ORAL | Status: AC
  Administered 2017-04-28: 650 mg via ORAL
  Filled 2017-04-28: qty 2

## 2017-04-28 NOTE — Discharge Instructions (Signed)
Be sure to keep the area clean and dry.  Make sure if you put a bandage on it, is not wet.  He can apply bacitracin or Neosporin ointment to the area.  The nail will grow back in several weeks.  Follow-up with your primary care doctor in the next 24-40 hours for further evaluation.  Return to the emergency department for any worsening redness or swelling of the finger, drainage from the site, fevers or any other worsening or concerning symptoms.

## 2017-04-28 NOTE — ED Provider Notes (Signed)
[facility] EMERGENCY DEPARTMENT Provider Note   CSN: [id number] Arrival date & time: 04/28/17  1636     History   Chief Complaint Chief Complaint  Patient presents with  . Fingernail injury    HPI Rachel Calhoun is a 22 y.o. female who presents for evaluation of left middle fingernail injury that occurred this evening. Patient reports that she was pulling on a pair of jeans when her finger got caught, causing it to bend back and causing the fingernail to break. She reports pain to the affected area. No fever, numbness, weakness.   The history is provided by the patient.    Past Medical History:  Diagnosis Date  . Anxiety   . Chronic back pain   . Gallstones   . Sciatica   . Sciatica     Patient Active Problem List   Diagnosis Date Noted  . Paronychia 12/18/2012    Past Surgical History:  Procedure Laterality Date  . CHOLECYSTECTOMY    . HERNIA REPAIR    . I&D EXTREMITY Right 12/18/2012   Procedure: IRRIGATION AND DEBRIDEMENT RIGHT RING FINGER;  Surgeon: Tami RibasKevin R Kuzma, MD;  Location: [facility] OR;  Service: Orthopedics;  Laterality: Right;     OB History    Gravida  1   Para      Term      Preterm      AB      Living        SAB      TAB      Ectopic      Multiple      Live Births               Home Medications    Prior to Admission medications   Medication Sig Start Date End Date Taking? Authorizing Provider  acetaminophen (TYLENOL) 325 MG tablet Take 650 mg by mouth every 6 (six) hours as needed for mild pain or headache.    [provider]  baclofen (LIORESAL) 10 MG tablet Take 1 tablet (10 mg total) by mouth 3 (three) times daily. 05/29/16   Arthor CaptainHarris, Abigail, PA-C  dicyclomine (BENTYL) 20 MG tablet Take 1 tablet (20 mg total) by mouth 2 (two) times daily. 04/26/17   Long, Arlyss RepressJoshua G, MD  gabapentin (NEURONTIN) 300 MG capsule Take 1 capsule (300 mg total) by mouth 2 (two) times daily. 12/02/16   Horton, Mayer Maskerourtney F, MD  ibuprofen  (ADVIL,MOTRIN) 200 MG tablet Take 200 mg by mouth every 6 (six) hours as needed.    [provider]  loperamide (IMODIUM) 2 MG capsule Take 1 capsule (2 mg total) by mouth 4 (four) times daily as needed for diarrhea or loose stools. 04/26/17   Long, Arlyss RepressJoshua G, MD  meloxicam (MOBIC) 15 MG tablet Take 1 tablet (15 mg total) by mouth daily. Take 1 daily with food. 05/29/16   Arthor CaptainHarris, Abigail, PA-C  meloxicam (MOBIC) 15 MG tablet Take 1 tablet (15 mg total) by mouth daily. 05/29/16   Harris, Cammy CopaAbigail, PA-C  naproxen (NAPROSYN) 500 MG tablet Take 1 tablet (500 mg total) by mouth 2 (two) times daily. 12/02/16   Horton, Mayer Maskerourtney F, MD  omeprazole (PRILOSEC) 20 MG capsule Take 1 capsule (20 mg total) by mouth daily. 02/06/16   Tilden Fossaees, Elizabeth, MD  ondansetron (ZOFRAN ODT) 4 MG disintegrating tablet Take 1 tablet (4 mg total) by mouth every 8 (eight) hours as needed for nausea or vomiting. 04/26/17   Long, Arlyss RepressJoshua G, MD  sucralfate (  CARAFATE) 1 GM/10ML suspension Take 10 mLs (1 g total) by mouth 4 (four) times daily -  with meals and at bedtime. 04/26/17   Long, Arlyss Repress, MD    Family History History reviewed. No pertinent family history.  Social History Social History   Tobacco Use  . Smoking status: Former Games developer  . Smokeless tobacco: Never Used  Substance Use Topics  . Alcohol use: No  . Drug use: No     Allergies   Belladonna alkaloids; Clindamycin/lincomycin; and Pb-hyoscy-atropine-scopolamine   Review of Systems Review of Systems  Skin: Positive for wound.  Neurological: Negative for weakness and numbness.     Physical Exam Updated Vital Signs BP 115/74   Pulse 93   Temp 98.7 F (37.1 C) (Oral)   Resp 17   Ht 5\' 2"  (1.575 m)   Wt 98.4 kg (217 lb)   SpO2 97%   BMI 39.69 kg/m   Physical Exam  Constitutional: She appears well-developed and well-nourished.  HENT:  Head: Normocephalic and atraumatic.  Eyes: Conjunctivae and EOM are normal. Right eye exhibits no discharge.  Left eye exhibits no discharge. No scleral icterus.  Cardiovascular:  Pulses:      Radial pulses are 2+ on the right side, and 2+ on the left side.  Pulmonary/Chest: Effort normal.  Musculoskeletal:  Tenderness palpation of the distal aspect of the left middle finger.  No deformity or crepitus noted.  Full range of motion of all 5 digits of left hand intact without any difficulty.  Neurological: She is alert.  Skin: Skin is warm and dry. Capillary refill takes less than 2 seconds.  Left middle finger with transverse breaking of the nail at the midway point.  No deformity or crepitus noted.  Full range of motion of all 5 digits of left hand without any difficulty.  Good distal cap refill.  LUE is not dusky in appearance or cool to touch.  Psychiatric: She has a normal mood and affect. Her speech is normal and behavior is normal.  Nursing note and vitals reviewed.    ED Treatments / Results  Labs (all labs ordered are listed, but only abnormal results are displayed) Labs Reviewed - No data to display  EKG None  Radiology Ct Angio Chest Pe W And/or Wo Contrast  Result Date: 04/26/2017 CLINICAL DATA:  Left-sided chest pain radiating from sternum with shortness-of-breath. Patient has nexplanon implant. EXAM: CT ANGIOGRAPHY CHEST WITH CONTRAST TECHNIQUE: Multidetector CT imaging of the chest was performed using the standard protocol during bolus administration of intravenous contrast. Multiplanar CT image reconstructions and MIPs were obtained to evaluate the vascular anatomy. CONTRAST:  ISOVUE-370 IOPAMIDOL (ISOVUE-370) INJECTION 76% COMPARISON:  None. FINDINGS: Cardiovascular: Heart is normal size. Thoracic aorta is within normal. Pulmonary arterial system is well opacified without emboli. Mediastinum/Nodes: No mediastinal or hilar adenopathy. Lungs/Pleura: Lungs are well inflated without focal airspace consolidation or effusion. Airways are normal. Upper Abdomen: Previous  cholecystectomy. Possible punctate stone over the upper pole left kidney. Musculoskeletal: Within normal. Review of the MIP images confirms the above findings. IMPRESSION: No acute cardiopulmonary disease and no evidence of pulmonary embolism. Possible punctate stone over the upper pole left kidney. Electronically Signed   By: Elberta Fortis M.D.   On: 04/26/2017 21:35   Dg Finger Middle Left  Result Date: 04/28/2017 CLINICAL DATA:  22 year old female with injury to the left middle finger, blunt trauma with finger nail broken and half. Pain. EXAM: LEFT MIDDLE FINGER 2+V COMPARISON:  None. FINDINGS:  There is no evidence of fracture or dislocation. There is no evidence of arthropathy or other focal bone abnormality. Soft tissues are within normal limits. Truncated appearance of the left 3rd finger nail noted. IMPRESSION: Negative. Electronically Signed   By: Odessa Fleming M.D.   On: 04/28/2017 19:19    Procedures .Nail Removal Date/Time: 04/28/2017 7:26 PM Performed by: Maxwell Caul, PA-C Authorized by: Maxwell Caul, PA-C   Consent:    Consent obtained:  Verbal   Consent given by:  Patient   Risks discussed:  Infection and incomplete removal Location:    Hand:  L long finger Pre-procedure details:    Skin preparation:  Alcohol Anesthesia (see MAR for exact dosages):    Anesthesia method:  Nerve block   Block needle gauge:  25 G   Block anesthetic:  Bupivacaine 0.25% w/o epi   Block injection procedure:  Anatomic landmarks identified, introduced needle, incremental injection and negative aspiration for blood   Block outcome:  Anesthesia achieved Nail Removal:    Nail removed:  Partial   Nail side:  Medial   Nail bed repaired: no   Post-procedure details:    Dressing:  4x4 sterile gauze and antibiotic ointment   Patient tolerance of procedure:  Tolerated well, no immediate complications Comments:     Once the patient was completely anesthetized, the area was extensively and  thoroughly irrigated with sterile saline.  There was a transverse line noted to the medial middle of the nail.  No lateral nailbed displacement.  Attempted to remove the nail but patient was still containing some pain.  Attempted to let patient rest but she removed the nail herself.  On further examination there appeared to be no evidence of nailbed laceration.  I offered to remove the rest of the nail but patient refused.   (including critical care time)  Medications Ordered in ED Medications  bupivacaine (MARCAINE) 0.25 % (with pres) injection 10 mL (10 mLs Infiltration Given 04/28/17 1850)  bacitracin ointment ( Topical Given 04/28/17 1854)  acetaminophen (TYLENOL) tablet 650 mg (650 mg Oral Given 04/28/17 1851)     Initial Impression / Assessment and Plan / ED Course  I have reviewed the triage vital signs and the nursing notes.  Pertinent labs & imaging results that were available during my care of the patient were reviewed by me and considered in my medical decision making (see chart for details).     22 year old female who presents for evaluation of left middle finger pain after she got caught in pants and broke the middle of her nail.  Patient states that she has not taken anything for the pain. Patient is neurovascularly intact.  On exam, patient has a transverse line on the middle aspect of her nail where it has broken.  No lateral nailbed displacement.  Plan to perform digital block and remove the nail.  Digital block performed.  Patient removed the nail herself.  Evaluation of the wound showed no nailbed laceration that would require stitching here in the ED.  I attempted to offer full removal of the rest of the nail that was partially remaining on the proximal nail bed.  But patient declined at this time.  X-ray shows no underlying fracture, dislocation.  The area was cleaned and irrigated.  Applied bacitracin and sterile dressing.  Patient stable for discharge at this time. Patient  had ample opportunity for questions and discussion. All patient's questions were answered with full understanding. Strict return precautions discussed. Patient expresses  understanding and agreement to plan.   Final Clinical Impressions(s) / ED Diagnoses   Final diagnoses:  Injury to fingernail of left hand, initial encounter    ED Discharge Orders    None       Rosana Hoes 04/28/17 1929    Rolland Porter, MD 05/04/17 2356

## 2017-04-28 NOTE — ED Triage Notes (Signed)
Patient states that she tore her left ring finger nail

## 2017-06-19 ENCOUNTER — Other Ambulatory Visit: Payer: Self-pay

## 2017-06-19 ENCOUNTER — Emergency Department (HOSPITAL_BASED_OUTPATIENT_CLINIC_OR_DEPARTMENT_OTHER): Payer: Worker's Compensation

## 2017-06-19 ENCOUNTER — Emergency Department (HOSPITAL_BASED_OUTPATIENT_CLINIC_OR_DEPARTMENT_OTHER)
Admission: EM | Admit: 2017-06-19 | Discharge: 2017-06-20 | Disposition: A | Discharge status: Home / Self Care | Payer: Self-pay | Attending: Emergency Medicine | Admitting: Emergency Medicine

## 2017-06-19 ENCOUNTER — Encounter (HOSPITAL_BASED_OUTPATIENT_CLINIC_OR_DEPARTMENT_OTHER): Payer: Self-pay | Admitting: Emergency Medicine

## 2017-06-19 DIAGNOSIS — Y9289 Other specified places as the place of occurrence of the external cause: Secondary | ICD-10-CM | POA: Insufficient documentation

## 2017-06-19 DIAGNOSIS — S60221A Contusion of right hand, initial encounter: Secondary | ICD-10-CM | POA: Insufficient documentation

## 2017-06-19 DIAGNOSIS — Y9389 Activity, other specified: Secondary | ICD-10-CM | POA: Insufficient documentation

## 2017-06-19 DIAGNOSIS — R2231 Localized swelling, mass and lump, right upper limb: Secondary | ICD-10-CM | POA: Insufficient documentation

## 2017-06-19 DIAGNOSIS — W228XXA Striking against or struck by other objects, initial encounter: Secondary | ICD-10-CM | POA: Insufficient documentation

## 2017-06-19 DIAGNOSIS — Z79899 Other long term (current) drug therapy: Secondary | ICD-10-CM | POA: Insufficient documentation

## 2017-06-19 DIAGNOSIS — Z87891 Personal history of nicotine dependence: Secondary | ICD-10-CM | POA: Insufficient documentation

## 2017-06-19 DIAGNOSIS — Y990 Civilian activity done for income or pay: Secondary | ICD-10-CM | POA: Insufficient documentation

## 2017-06-19 HISTORY — DX: Other intervertebral disc degeneration, lumbar region: M51.36

## 2017-06-19 HISTORY — DX: Other intervertebral disc degeneration, lumbar region without mention of lumbar back pain or lower extremity pain: M51.369

## 2017-06-19 NOTE — ED Triage Notes (Signed)
Pt at work, injury to right hand.

## 2017-06-20 MED ORDER — IBUPROFEN 800 MG PO TABS: 800.0000 mg | ORAL_TABLET | Freq: Three times a day (TID) | ORAL | 0 refills | Status: DC | PRN

## 2017-06-20 NOTE — Discharge Instructions (Signed)
It was my pleasure taking care of you today!   Please call the orthopedic doctor listed on Monday to schedule a follow up appointment.   Ibuprofen as needed for pain. You can alternate with Tylenol as needed.   It is very important to keep your splint dry until your follow up with the orthopedic doctor and a cast can be applied. You may place a plastic bag around the extremity with the splint while bathing to keep it dry. Also try to sleep with the extremity elevated for the next several nights to decrease swelling. Check the fingertips several times per day to make sure they are not cold, pale, or blue. If this is the case, the splint may be too tight and should return to the ER, your regular doctor or the orthopedist for recheck. Return to the ER for new or worsening symptoms, any additional concerns.

## 2017-06-20 NOTE — ED Provider Notes (Signed)
[facility] EMERGENCY DEPARTMENT Provider Note   CSN: [id number] Arrival date & time: 06/19/17  2224     History   Chief Complaint Chief Complaint  Patient presents with  . Hand Injury    HPI Rachel Calhoun is a 22 y.o. female.  The history is provided by the patient and medical records. No language interpreter was used.  Hand Injury     Rachel Calhoun is a 22 y.o. female who can see department for acute onset of right hand pain.  Associated with right hand swelling.  She was at work and hit her hand up against a door handle.  No medications taken prior to arrival for symptoms.  Feels as if pain starts in the hand and then will radiate up her arm.  No numbness.  Difficult to move her thumb index finger and middle finger.  This motion also causes significant amount pain.  No prior injuries to this upper extremity.  Past Medical History:  Diagnosis Date  . Anxiety   . Chronic back pain   . DDD (degenerative disc disease), lumbar   . Gallstones   . Sciatica   . Sciatica     Patient Active Problem List   Diagnosis Date Noted  . Paronychia 12/18/2012    Past Surgical History:  Procedure Laterality Date  . CHOLECYSTECTOMY    . HERNIA REPAIR    . I&D EXTREMITY Right 12/18/2012   Procedure: IRRIGATION AND DEBRIDEMENT RIGHT RING FINGER;  Surgeon: Tami Ribas, MD;  Location: [facility] OR;  Service: Orthopedics;  Laterality: Right;     OB History    Gravida  1   Para      Term      Preterm      AB      Living        SAB      TAB      Ectopic      Multiple      Live Births               Home Medications    Prior to Admission medications   Medication Sig Start Date End Date Taking? Authorizing Provider  acetaminophen (TYLENOL) 325 MG tablet Take 650 mg by mouth every 6 (six) hours as needed for mild pain or headache.    [provider]  baclofen (LIORESAL) 10 MG tablet Take 1 tablet (10 mg total) by mouth 3 (three) times daily. 05/29/16    Arthor Captain, PA-C  dicyclomine (BENTYL) 20 MG tablet Take 1 tablet (20 mg total) by mouth 2 (two) times daily. 04/26/17   Long, Arlyss Repress, MD  gabapentin (NEURONTIN) 300 MG capsule Take 1 capsule (300 mg total) by mouth 2 (two) times daily. 12/02/16   Horton, Mayer Masker, MD  ibuprofen (ADVIL,MOTRIN) 800 MG tablet Take 1 tablet (800 mg total) by mouth every 8 (eight) hours as needed. 06/20/17   Ward, Chase Picket, PA-C  loperamide (IMODIUM) 2 MG capsule Take 1 capsule (2 mg total) by mouth 4 (four) times daily as needed for diarrhea or loose stools. 04/26/17   Long, Arlyss Repress, MD  meloxicam (MOBIC) 15 MG tablet Take 1 tablet (15 mg total) by mouth daily. Take 1 daily with food. 05/29/16   Arthor Captain, PA-C  meloxicam (MOBIC) 15 MG tablet Take 1 tablet (15 mg total) by mouth daily. 05/29/16   Harris, Cammy Copa, PA-C  naproxen (NAPROSYN) 500 MG tablet Take 1 tablet (500 mg total) by mouth  2 (two) times daily. 12/02/16   Horton, Mayer Masker, MD  omeprazole (PRILOSEC) 20 MG capsule Take 1 capsule (20 mg total) by mouth daily. 02/06/16   Tilden Fossa, MD  ondansetron (ZOFRAN ODT) 4 MG disintegrating tablet Take 1 tablet (4 mg total) by mouth every 8 (eight) hours as needed for nausea or vomiting. 04/26/17   Long, Arlyss Repress, MD  sucralfate (CARAFATE) 1 GM/10ML suspension Take 10 mLs (1 g total) by mouth 4 (four) times daily -  with meals and at bedtime. 04/26/17   Long, Arlyss Repress, MD    Family History No family history on file.  Social History Social History   Tobacco Use  . Smoking status: Former Games developer  . Smokeless tobacco: Never Used  Substance Use Topics  . Alcohol use: No  . Drug use: No     Allergies   Belladonna alkaloids; Clindamycin/lincomycin; and Pb-hyoscy-atropine-scopolamine   Review of Systems Review of Systems  Musculoskeletal: Positive for arthralgias, joint swelling and myalgias.  Skin: Negative for color change and wound.  Neurological: Negative for weakness and numbness.       Physical Exam Updated Vital Signs BP 123/87   Pulse 93   Temp 98.2 F (36.8 C)   Resp 20   Ht  (1.575 m)   Wt 98.4 kg (217 lb)   SpO2 97%   BMI 39.69 kg/m   Physical Exam  Constitutional: She appears well-developed and well-nourished. No distress.  HENT:  Head: Normocephalic and atraumatic.  Neck: Neck supple.  Cardiovascular: Normal rate, regular rhythm and normal heart sounds.  No murmur heard. Pulmonary/Chest: Effort normal and breath sounds normal. No respiratory distress. She has no wheezes. She has no rales.  Musculoskeletal:  Tenderness to palpation of right anatomical snuffbox.  Does have associated swelling to the area as well.  Good cap refill in all digits.  Sensation intact to radial, median and ulnar nerve distributions.  2+ radial pulse.   Neurological: She is alert.  Skin: Skin is warm and dry.  Nursing note and vitals reviewed.    ED Treatments / Results  Labs (all labs ordered are listed, but only abnormal results are displayed) Labs Reviewed - No data to display  EKG None  Radiology Dg Hand Complete Right  Result Date: 06/19/2017 CLINICAL DATA:  C/o hit Rt hand against car door today and has pain/swelling in Rt thumb, index and middle fingers radiating up hand EXAM: RIGHT HAND - COMPLETE 3+ VIEW COMPARISON:  None. FINDINGS: There is no evidence of fracture or dislocation. There is no evidence of arthropathy or other focal bone abnormality. Soft tissues are unremarkable. IMPRESSION: Negative. Electronically Signed   By: Bary Richard M.D.   On: 06/19/2017 23:14    Procedures Procedures (including critical care time)  Medications Ordered in ED Medications - No data to display   Initial Impression / Assessment and Plan / ED Course  I have reviewed the triage vital signs and the nursing notes.  Pertinent labs & imaging results that were available during my care of the patient were reviewed by me and considered in my medical decision  making (see chart for details).    Rachel Calhoun is a 22 y.o. female who presents to ED for acute onset of right hand pain after hitting her hand against a door handle.  On exam, she is neurovascularly intact.  She does have tenderness to the anatomical snuffbox area with associated swelling.  X-ray is negative, however given clinical presentation, will  place in thumb spica to treat for occult fracture.  Ortho follow-up.  Home care instructions, return precautions and follow-up care was discussed with patient.  She understands and agrees with plan as dictated above.  All questions answered.    Final Clinical Impressions(s) / ED Diagnoses   Final diagnoses:  Contusion of right hand, initial encounter    ED Discharge Orders        Ordered    ibuprofen (ADVIL,MOTRIN) 800 MG tablet  Every 8 hours PRN     06/20/17 0009       Ward, Chase Picket, PA-C 06/20/17 0022    Molpus, Jonny Ruiz, MD 06/20/17 [phone]

## 2017-09-21 ENCOUNTER — Emergency Department (HOSPITAL_BASED_OUTPATIENT_CLINIC_OR_DEPARTMENT_OTHER)
Admission: EM | Admit: 2017-09-21 | Discharge: 2017-09-21 | Disposition: A | Discharge status: Home / Self Care | Payer: Self-pay | Attending: Emergency Medicine | Admitting: Emergency Medicine

## 2017-09-21 ENCOUNTER — Encounter (HOSPITAL_BASED_OUTPATIENT_CLINIC_OR_DEPARTMENT_OTHER): Payer: Self-pay | Admitting: Emergency Medicine

## 2017-09-21 ENCOUNTER — Other Ambulatory Visit: Payer: Self-pay

## 2017-09-21 DIAGNOSIS — K91872 Postprocedural seroma of a digestive system organ or structure following a digestive system procedure: Secondary | ICD-10-CM | POA: Insufficient documentation

## 2017-09-21 DIAGNOSIS — Z87891 Personal history of nicotine dependence: Secondary | ICD-10-CM | POA: Insufficient documentation

## 2017-09-21 DIAGNOSIS — Z79899 Other long term (current) drug therapy: Secondary | ICD-10-CM | POA: Insufficient documentation

## 2017-09-21 DIAGNOSIS — L03312 Cellulitis of back [any part except buttock]: Secondary | ICD-10-CM | POA: Insufficient documentation

## 2017-09-21 DIAGNOSIS — S301XXA Contusion of abdominal wall, initial encounter: Secondary | ICD-10-CM

## 2017-09-21 MED ORDER — CEPHALEXIN 500 MG PO CAPS: 500.0000 mg | ORAL_CAPSULE | Freq: Three times a day (TID) | ORAL | 0 refills | Status: DC

## 2017-09-21 MED ORDER — CEPHALEXIN 250 MG PO CAPS
500.0000 mg | ORAL_CAPSULE | Freq: Once | ORAL | Status: AC
  Administered 2017-09-21: 500 mg via ORAL
  Filled 2017-09-21: qty 2

## 2017-09-21 NOTE — Discharge Instructions (Addendum)
Warm compresses to the back wound several times a day. Keflex as prescribed until all gone. Follow up with surgery or plastics or dermatology for removal of the cyst/seroma. Return if any signs of infection.

## 2017-09-21 NOTE — ED Triage Notes (Signed)
Pt reports surgical scar to RLQ is producing pus x months; surg was 2015

## 2017-09-21 NOTE — ED Provider Notes (Signed)
[facility] EMERGENCY DEPARTMENT Provider Note   CSN: [id number] Arrival date & time: 09/21/17  1724     History   Chief Complaint Chief Complaint  Patient presents with  . Wound Infection    HPI Rachel Calhoun is a 22 y.o. female.  HPI Rachel Calhoun is a 22 y.o. female with hx of chronic back pain, anxiety, presents to ED with complaint of a cyst to the old surgical incision and a wound to the back.  Patient states that she has had a draining cyst to 1 of her incisions over her abdomen from a gallbladder surgery which she had done in 2015.  She states that she is ready for the cyst to be removed.  She has tried to follow-up but states that she has no insurance and they would not see her.  She denies any pain to the area.  She states that is draining sometimes clear fluid.  She states is just irritating her.  She also thinks she had a spider bite to her back that she would like to check.  She reports pain to the back area with some drainage from the wound that started yesterday.  No fever or chills.  No nausea or vomiting.  No treatment prior to coming in.  Past Medical History:  Diagnosis Date  . Anxiety   . Chronic back pain   . DDD (degenerative disc disease), lumbar   . Gallstones   . Sciatica   . Sciatica     Patient Active Problem List   Diagnosis Date Noted  . Paronychia 12/18/2012    Past Surgical History:  Procedure Laterality Date  . CHOLECYSTECTOMY    . HERNIA REPAIR    . I&D EXTREMITY Right 12/18/2012   Procedure: IRRIGATION AND DEBRIDEMENT RIGHT RING FINGER;  Surgeon: Tami RibasKevin R Kuzma, MD;  Location: [facility] OR;  Service: Orthopedics;  Laterality: Right;     OB History    Gravida  1   Para      Term      Preterm      AB      Living        SAB      TAB      Ectopic      Multiple      Live Births               Home Medications    Prior to Admission medications   Medication Sig Start Date End Date Taking? Authorizing Provider    acetaminophen (TYLENOL) 325 MG tablet Take 650 mg by mouth every 6 (six) hours as needed for mild pain or headache.    [provider]  baclofen (LIORESAL) 10 MG tablet Take 1 tablet (10 mg total) by mouth 3 (three) times daily. 05/29/16   Arthor CaptainHarris, Abigail, PA-C  dicyclomine (BENTYL) 20 MG tablet Take 1 tablet (20 mg total) by mouth 2 (two) times daily. 04/26/17   Long, Arlyss RepressJoshua G, MD  gabapentin (NEURONTIN) 300 MG capsule Take 1 capsule (300 mg total) by mouth 2 (two) times daily. 12/02/16   Horton, Mayer Maskerourtney F, MD  ibuprofen (ADVIL,MOTRIN) 800 MG tablet Take 1 tablet (800 mg total) by mouth every 8 (eight) hours as needed. 06/20/17   Ward, Chase PicketJaime Pilcher, PA-C  loperamide (IMODIUM) 2 MG capsule Take 1 capsule (2 mg total) by mouth 4 (four) times daily as needed for diarrhea or loose stools. 04/26/17   Long, Arlyss RepressJoshua G, MD  meloxicam (MOBIC) 15 MG  tablet Take 1 tablet (15 mg total) by mouth daily. Take 1 daily with food. 05/29/16   Arthor CaptainHarris, Abigail, PA-C  meloxicam (MOBIC) 15 MG tablet Take 1 tablet (15 mg total) by mouth daily. 05/29/16   Harris, Cammy CopaAbigail, PA-C  naproxen (NAPROSYN) 500 MG tablet Take 1 tablet (500 mg total) by mouth 2 (two) times daily. 12/02/16   Horton, Mayer Maskerourtney F, MD  omeprazole (PRILOSEC) 20 MG capsule Take 1 capsule (20 mg total) by mouth daily. 02/06/16   Tilden Fossaees, Elizabeth, MD  ondansetron (ZOFRAN ODT) 4 MG disintegrating tablet Take 1 tablet (4 mg total) by mouth every 8 (eight) hours as needed for nausea or vomiting. 04/26/17   Long, Arlyss RepressJoshua G, MD  sucralfate (CARAFATE) 1 GM/10ML suspension Take 10 mLs (1 g total) by mouth 4 (four) times daily -  with meals and at bedtime. 04/26/17   Long, Arlyss RepressJoshua G, MD    Family History No family history on file.  Social History Social History   Tobacco Use  . Smoking status: Former Games developermoker  . Smokeless tobacco: Never Used  Substance Use Topics  . Alcohol use: No  . Drug use: No     Allergies   Belladonna alkaloids;  Clindamycin/lincomycin; and Pb-hyoscy-atropine-scopolamine   Review of Systems Review of Systems  Constitutional: Negative for chills and fever.  Respiratory: Negative for cough, chest tightness and shortness of breath.   Cardiovascular: Negative for chest pain, palpitations and leg swelling.  Gastrointestinal: Negative for abdominal pain, diarrhea, nausea and vomiting.  Genitourinary: Negative for dysuria and flank pain.  Musculoskeletal: Negative for arthralgias, myalgias, neck pain and neck stiffness.  Skin: Positive for wound. Negative for rash.  Neurological: Negative for dizziness, weakness and headaches.  All other systems reviewed and are negative.    Physical Exam Updated Vital Signs BP 114/89 (BP Location: Right Arm)   Pulse 90   Temp 98.8 F (37.1 C) (Oral)   Resp 16   Ht 5\' 2"  (1.575 m)   Wt 98.4 kg   SpO2 97%   BMI 39.69 kg/m   Physical Exam  Constitutional: She appears well-developed and well-nourished. No distress.  Eyes: Conjunctivae are normal.  Neck: Neck supple.  Abdominal: Soft. There is no tenderness.  About 1x2 cm cyst to the old healed incision to the lower abdomen from what appears to be a trochanter scar. Non tender. No drainage. There is a pustule to the back with some surrounding induration and erythema right around right periscapular area. No drainage or fluctuance.   Neurological: She is alert.  Skin: Skin is warm and dry.  Nursing note and vitals reviewed.    ED Treatments / Results  Labs (all labs ordered are listed, but only abnormal results are displayed) Labs Reviewed - No data to display  EKG None  Radiology No results found.  Procedures Procedures (including critical care time)  Medications Ordered in ED Medications - No data to display   Initial Impression / Assessment and Plan / ED Course  I have reviewed the triage vital signs and the nursing notes.  Pertinent labs & imaging results that were available during my  care of the patient were reviewed by me and considered in my medical decision making (see chart for details).     Pt in ED with a percistent cyst for the last 4 years at the site of prior incision.  Most likely a seroma.  I offered I&D to help to drain some of the fluid, but explained that the entire excision  of this cyst will need to be done by a general surgeon or plastic surgeon.  Patient refused I&D in emergency department.  Patient does have a little area of erythema around a pustule to her back that she thinks is from a spider bite.  At this time there is no necrosis.  There is no drainable fluid collection.  I will start her on antibiotics.  I will have her follow-up with family doctor as needed.  Return precautions discussed.  Vitals:   09/21/17 1732 09/21/17 1733 09/21/17 1952  BP: 123/81  114/89  Pulse: 99  90  Resp: 18  16  Temp: 98.8 F (37.1 C)    TempSrc: Oral    SpO2: 97%  97%  Weight:  98.4 kg   Height:  5\' 2"  (1.575 m)      Final Clinical Impressions(s) / ED Diagnoses   Final diagnoses:  Abdominal wall seroma, initial encounter  Cellulitis of back    ED Discharge Orders         Ordered    cephALEXin (KEFLEX) 500 MG capsule  3 times daily     09/21/17 2009           Jaynie Crumble, [place] 09/21/17 2010    Tegeler, Canary Brim, MD 09/22/17 0010

## 2017-10-02 ENCOUNTER — Encounter (HOSPITAL_COMMUNITY): Payer: Self-pay | Admitting: Emergency Medicine

## 2017-10-02 ENCOUNTER — Emergency Department (HOSPITAL_COMMUNITY)
Admission: EM | Admit: 2017-10-02 | Discharge: 2017-10-02 | Disposition: A | Discharge status: Home / Self Care | Payer: Self-pay | Attending: Emergency Medicine | Admitting: Emergency Medicine

## 2017-10-02 ENCOUNTER — Other Ambulatory Visit: Payer: Self-pay

## 2017-10-02 DIAGNOSIS — Z9049 Acquired absence of other specified parts of digestive tract: Secondary | ICD-10-CM | POA: Insufficient documentation

## 2017-10-02 DIAGNOSIS — K91872 Postprocedural seroma of a digestive system organ or structure following a digestive system procedure: Secondary | ICD-10-CM | POA: Insufficient documentation

## 2017-10-02 DIAGNOSIS — L0390 Cellulitis, unspecified: Secondary | ICD-10-CM

## 2017-10-02 DIAGNOSIS — L03311 Cellulitis of abdominal wall: Secondary | ICD-10-CM | POA: Insufficient documentation

## 2017-10-02 DIAGNOSIS — Z79899 Other long term (current) drug therapy: Secondary | ICD-10-CM | POA: Insufficient documentation

## 2017-10-02 DIAGNOSIS — S3011XA Contusion of abdominal wall, initial encounter: Secondary | ICD-10-CM

## 2017-10-02 DIAGNOSIS — S301XXA Contusion of abdominal wall, initial encounter: Secondary | ICD-10-CM

## 2017-10-02 LAB — POC URINE PREG, ED: Preg Test, Ur: NEGATIVE

## 2017-10-02 MED ORDER — SULFAMETHOXAZOLE-TRIMETHOPRIM 800-160 MG PO TABS: 1.0000 | ORAL_TABLET | Freq: Two times a day (BID) | ORAL | 0 refills | Status: AC

## 2017-10-02 MED ORDER — HYDROCODONE-ACETAMINOPHEN 5-325 MG PO TABS
2.0000 | ORAL_TABLET | Freq: Once | ORAL | Status: AC
  Administered 2017-10-02: 2 via ORAL
  Filled 2017-10-02: qty 2

## 2017-10-02 MED ORDER — LIDOCAINE HCL 2 % IJ SOLN
10.0000 mL | Freq: Once | INTRAMUSCULAR | Status: AC
  Administered 2017-10-02: 200 mg via INTRADERMAL
  Filled 2017-10-02: qty 20

## 2017-10-02 NOTE — ED Provider Notes (Signed)
[facility] EMERGENCY DEPARTMENT Provider Note   CSN: [id number] Arrival date & time: 10/02/17  1813     History   Chief Complaint Chief Complaint  Patient presents with  . Abscess    HPI Rachel Calhoun is a 22 y.o. female who presents for evaluation of progressive worsening redness, pain to the right lower abdomen.  She was seen in the ED on 09/21/2016 for evaluation of area of swelling to the right lower quadrant surgical scar.  She reports she had a cholecystectomy several years ago and reports that she had noticed some swelling to the scar.  Additionally, she noticed some draining pus to the area.  She came to the ED for evaluation.  At that time, an incision and drainage was offered but patient declined.  Patient had additional bite that she thought was an insect bite noted to her arm.  Patient was given Keflex which she has been taking.  Patient comes the ED today because she is having worsening redness, swelling to the area on her stomach.  She states that the redness increase in the family.  She has not noticed any drainage.  Patient denies any fevers, nausea/vomiting.  Patient was instructed to follow-up with outpatient surgery to get it removed.  Patient reports that she has an appointment scheduled with him on 10/19/2017.  The history is provided by the patient.    Past Medical History:  Diagnosis Date  . Anxiety   . Chronic back pain   . DDD (degenerative disc disease), lumbar   . Gallstones   . Sciatica   . Sciatica     Patient Active Problem List   Diagnosis Date Noted  . Paronychia 12/18/2012    Past Surgical History:  Procedure Laterality Date  . CHOLECYSTECTOMY    . HERNIA REPAIR    . I&D EXTREMITY Right 12/18/2012   Procedure: IRRIGATION AND DEBRIDEMENT RIGHT RING FINGER;  Surgeon: Tami Ribas, MD;  Location: [facility] OR;  Service: Orthopedics;  Laterality: Right;     OB History    Gravida  1   Para      Term      Preterm      AB        Living        SAB      TAB      Ectopic      Multiple      Live Births               Home Medications    Prior to Admission medications   Medication Sig Start Date End Date Taking? Authorizing Provider  acetaminophen (TYLENOL) 325 MG tablet Take 650 mg by mouth every 6 (six) hours as needed for mild pain or headache.    [provider]  baclofen (LIORESAL) 10 MG tablet Take 1 tablet (10 mg total) by mouth 3 (three) times daily. 05/29/16   Harris, Cammy Copa, PA-C  cephALEXin (KEFLEX) 500 MG capsule Take 1 capsule (500 mg total) by mouth 3 (three) times daily. 09/21/17   Kirichenko, Tatyana, PA-C  dicyclomine (BENTYL) 20 MG tablet Take 1 tablet (20 mg total) by mouth 2 (two) times daily. 04/26/17   Long, Arlyss Repress, MD  gabapentin (NEURONTIN) 300 MG capsule Take 1 capsule (300 mg total) by mouth 2 (two) times daily. 12/02/16   Horton, Mayer Masker, MD  ibuprofen (ADVIL,MOTRIN) 800 MG tablet Take 1 tablet (800 mg total) by mouth every 8 (eight) hours as needed.  06/20/17   Ward, Chase PicketJaime Pilcher, PA-C  loperamide (IMODIUM) 2 MG capsule Take 1 capsule (2 mg total) by mouth 4 (four) times daily as needed for diarrhea or loose stools. 04/26/17   Long, Arlyss RepressJoshua G, MD  meloxicam (MOBIC) 15 MG tablet Take 1 tablet (15 mg total) by mouth daily. Take 1 daily with food. 05/29/16   Arthor CaptainHarris, Abigail, PA-C  meloxicam (MOBIC) 15 MG tablet Take 1 tablet (15 mg total) by mouth daily. 05/29/16   Harris, Cammy CopaAbigail, PA-C  naproxen (NAPROSYN) 500 MG tablet Take 1 tablet (500 mg total) by mouth 2 (two) times daily. 12/02/16   Horton, Mayer Maskerourtney F, MD  omeprazole (PRILOSEC) 20 MG capsule Take 1 capsule (20 mg total) by mouth daily. 02/06/16   Tilden Fossaees, Elizabeth, MD  ondansetron (ZOFRAN ODT) 4 MG disintegrating tablet Take 1 tablet (4 mg total) by mouth every 8 (eight) hours as needed for nausea or vomiting. 04/26/17   Long, Arlyss RepressJoshua G, MD  sucralfate (CARAFATE) 1 GM/10ML suspension Take 10 mLs (1 g total) by mouth 4 (four)  times daily -  with meals and at bedtime. 04/26/17   Long, Arlyss RepressJoshua G, MD  sulfamethoxazole-trimethoprim (BACTRIM DS,SEPTRA DS) 800-160 MG tablet Take 1 tablet by mouth 2 (two) times daily for 7 days. 10/02/17 10/09/17  Maxwell CaulLayden, Graves Nipp A, PA-C    Family History History reviewed. No pertinent family history.  Social History Social History   Tobacco Use  . Smoking status: Former Games developermoker  . Smokeless tobacco: Never Used  Substance Use Topics  . Alcohol use: No  . Drug use: No     Allergies   Belladonna alkaloids; Clindamycin/lincomycin; and Pb-hyoscy-atropine-scopolamine   Review of Systems Review of Systems  Constitutional: Negative for fever.  Gastrointestinal: Negative for nausea and vomiting.  Skin: Positive for color change.  All other systems reviewed and are negative.    Physical Exam Updated Vital Signs BP 123/85   Pulse 80   Temp 98.2 F (36.8 C) (Oral)   Resp 18   Ht 5\' 2"  (1.575 m)   Wt 98.4 kg   SpO2 99%   BMI 39.69 kg/m   Physical Exam  Constitutional: She appears well-developed and well-nourished.  HENT:  Head: Normocephalic and atraumatic.  Eyes: Conjunctivae and EOM are normal. Right eye exhibits no discharge. Left eye exhibits no discharge. No scleral icterus.  Pulmonary/Chest: Effort normal.  Abdominal: Normal appearance. There is no tenderness.  Abdomen is soft, non-distended, non-tender. No rigidity, No guarding. No peritoneal signs.  Neurological: She is alert.  Skin: Skin is warm and dry.  Well-healed surgical incision scar noted to the right lower quadrant of abdomen.  There is some surrounding erythema.  No purulent drainage noted.  Psychiatric: She has a normal mood and affect. Her speech is normal and behavior is normal.  Nursing note and vitals reviewed.    ED Treatments / Results  Labs (all labs ordered are listed, but only abnormal results are displayed) Labs Reviewed  POC URINE PREG, ED    EKG None  Radiology No results  found.  Procedures .Marland Kitchen.Incision and Drainage Date/Time: 10/02/2017 9:58 PM Performed by: Maxwell CaulLayden, Elmus Mathes A, PA-C Authorized by: Maxwell CaulLayden, Akram Kissick A, PA-C   Consent:    Consent obtained:  Verbal   Consent given by:  Patient   Alternatives discussed:  No treatment Location:    Type:  Seroma   Location:  Trunk   Trunk location:  Abdomen Pre-procedure details:    Skin preparation:  Betadine Anesthesia (see MAR for  exact dosages):    Anesthesia method:  Local infiltration   Local anesthetic:  Lidocaine 1% w/o epi Procedure type:    Complexity:  Simple Procedure details:    Incision types:  Stab incision   Scalpel blade:  11   Wound management:  Irrigated with saline and probed and deloculated   Drainage:  Serosanguinous   Drainage amount:  Moderate   Wound treatment:  Wound left open Post-procedure details:    Patient tolerance of procedure:  Tolerated well, no immediate complications   (including critical care time)  Medications Ordered in ED Medications  lidocaine (XYLOCAINE) 2 % (with pres) injection 200 mg (200 mg Intradermal Given 10/02/17 2007)  HYDROcodone-acetaminophen (NORCO/VICODIN) 5-325 MG per tablet 2 tablet (2 tablets Oral Given 10/02/17 2006)     Initial Impression / Assessment and Plan / ED Course  I have reviewed the triage vital signs and the nursing notes.  Pertinent labs & imaging results that were available during my care of the patient were reviewed by me and considered in my medical decision making (see chart for details).     22 year old female who presents for evaluation of redness, pain noted to a wound to the right lower abdomen.  Was seen here last week and declined I&D.  Patient with no fevers.  Has an appointment with outpatient surgery for the 16th. Patient is afebrile, non-toxic appearing, sitting comfortably on examination table. Vital signs reviewed and stable.  On exam, she does have a well-healed scar noted to the right lower quadrant.  There  is some surrounding erythema.  There is a small area of fluctuance but no purulent drainage.  Concern for seroma versus abscess.  Bedside ultrasound applied.  It does show a 2 cm collection of fluid that is approximately 1 cm deep.  I discussed treatment options with patient.  I discussed with this is a seroma, will eventually need to be surgically removed and the entire thing excised.  I offered to perform perform a small I&D to help make the area smaller and did help with symptomatic relief.  Discussed risk first benefits.  Patient understands risk first benefits and wishes to proceed with I&D.  I&D as documented above.  He did appear to be a seroma as it was some serosanguineous fluid.  No evidence of purulent material.  Patient tolerated procedure well.  Given that there was surrounding erythema concerning for cellulitis, will start patient on antibiotic.  Encourage outpatient follow-up with general surgery. Patient had ample opportunity for questions and discussion. All patient's questions were answered with full understanding. Strict return precautions discussed. Patient expresses understanding and agreement to plan.   EMERGENCY DEPARTMENT US SOFT TISSUE INTERPRETATION "Study: Limited Soft Tissue Ultrasound"  INDICATIONS: Pain Multiple views of the body part were obtained in real-time with a multi-frequency linear probe  PERFORMED BY: Myself IMAGES ARCHIVED?: Yes SIDE:Right  BODY PART:Abdominal wall INTERPRETATION:  Seroma    Final Clinical Impressions(s) / ED Diagnoses   Final diagnoses:  Cellulitis, unspecified cellulitis site  Abdominal wall seroma, initial encounter    ED Discharge Orders         Ordered    sulfamethoxazole-trimethoprim (BACTRIM DS,SEPTRA DS) 800-160 MG tablet  2 times daily     10/02/17 2116           Maxwell Caul, PA-C 10/02/17 2201    Maia Plan, MD 10/03/17 [phone]

## 2017-10-02 NOTE — ED Notes (Signed)
Patient given discharge instructions and verbalized understanding.  Patient stable to discharge at this time.  Patient is alert and oriented to baseline.  No distressed noted at this time.  All belongings taken with the patient at discharge.   

## 2017-10-02 NOTE — ED Triage Notes (Signed)
Pt reports having cyst to surgical scar lower abdomen from 2015. Pt seen in ED last week and told to come back with increased pain and redness. Pt endorses increase in size. Pt has redness spreading from area. Pt denies fever, nausea, vomiting.

## 2017-10-02 NOTE — Discharge Instructions (Signed)
You can take Tylenol or Ibuprofen as directed for pain. You can alternate Tylenol and Ibuprofen every 4 hours. If you take Tylenol at 1pm, then you can take Ibuprofen at 5pm. Then you can take Tylenol again at 9pm.   Take antibiotics as directed. Please take all of your antibiotics until finished.  Continue to apply warm compresses.  Call your surgeon to arrange for an appointment to get it surgically removed.  Additionally, I provided outpatient referral to general surgery.  They may be able to see you.  Return to emergency department for any fever, worsening pain, worsening redness or swelling, any other worsening or concerning symptoms.

## 2017-10-02 NOTE — ED Provider Notes (Signed)
Patient placed in Quick Look pathway, seen and evaluated   Chief Complaint: abscess  HPI:   Rachel Calhoun is a 22 y.o. female who presents to the ED with a cyst to the area of a surgical scar on the abdomen. The cholecystectomy was done in 2015 and patient reports that the area would occasionally have a little drainage and then get better. This time the swelling and pain is worse and there is redness to the abdomen  ROS: Skin: tender, red raised area   Physical Exam:  BP 118/80   Pulse 93   Temp 98.2 F (36.8 C) (Oral)   Resp 18   Ht 5\' 2"  (1.575 m)   Wt 98.4 kg   SpO2 96%   BMI 39.69 kg/m    Gen: No distress  Neuro: Awake and Alert  Skin: tender raised area to the right lower abdomen with erythema      Initiation of care has begun. The patient has been counseled on the process, plan, and necessity for staying for the completion/evaluation, and the remainder of the medical screening examination    Janne Napoleoneese, Rhett Najera M, NP 10/02/17 Harlan [id number]    Campos, Kevin, MD 10/03/17 [phone]

## 2017-10-02 NOTE — ED Notes (Signed)
Urine Pregnancy is negative. 

## 2017-12-28 ENCOUNTER — Encounter (HOSPITAL_BASED_OUTPATIENT_CLINIC_OR_DEPARTMENT_OTHER): Payer: Self-pay | Admitting: *Deleted

## 2017-12-28 ENCOUNTER — Emergency Department (HOSPITAL_BASED_OUTPATIENT_CLINIC_OR_DEPARTMENT_OTHER): Payer: Self-pay

## 2017-12-28 ENCOUNTER — Other Ambulatory Visit: Payer: Self-pay

## 2017-12-28 ENCOUNTER — Emergency Department (HOSPITAL_BASED_OUTPATIENT_CLINIC_OR_DEPARTMENT_OTHER)
Admission: EM | Admit: 2017-12-28 | Discharge: 2017-12-28 | Disposition: A | Discharge status: Home / Self Care | Payer: Self-pay | Attending: Emergency Medicine | Admitting: Emergency Medicine

## 2017-12-28 DIAGNOSIS — Y998 Other external cause status: Secondary | ICD-10-CM | POA: Insufficient documentation

## 2017-12-28 DIAGNOSIS — M25571 Pain in right ankle and joints of right foot: Secondary | ICD-10-CM

## 2017-12-28 DIAGNOSIS — Y92008 Other place in unspecified non-institutional (private) residence as the place of occurrence of the external cause: Secondary | ICD-10-CM | POA: Insufficient documentation

## 2017-12-28 DIAGNOSIS — Z79899 Other long term (current) drug therapy: Secondary | ICD-10-CM | POA: Insufficient documentation

## 2017-12-28 DIAGNOSIS — Z87891 Personal history of nicotine dependence: Secondary | ICD-10-CM | POA: Insufficient documentation

## 2017-12-28 DIAGNOSIS — S99911A Unspecified injury of right ankle, initial encounter: Secondary | ICD-10-CM | POA: Insufficient documentation

## 2017-12-28 DIAGNOSIS — Y9389 Activity, other specified: Secondary | ICD-10-CM | POA: Insufficient documentation

## 2017-12-28 DIAGNOSIS — F419 Anxiety disorder, unspecified: Secondary | ICD-10-CM | POA: Insufficient documentation

## 2017-12-28 DIAGNOSIS — W228XXA Striking against or struck by other objects, initial encounter: Secondary | ICD-10-CM | POA: Insufficient documentation

## 2017-12-28 MED ORDER — IBUPROFEN 800 MG PO TABS: 800.0000 mg | ORAL_TABLET | Freq: Three times a day (TID) | ORAL | 0 refills | Status: DC | PRN

## 2017-12-28 MED ORDER — ACETAMINOPHEN 500 MG PO TABS
1000.0000 mg | ORAL_TABLET | Freq: Once | ORAL | Status: AC
Start: 2017-12-28 — End: 2017-12-28
  Administered 2017-12-28: 1000 mg via ORAL
  Filled 2017-12-28: qty 2

## 2017-12-28 NOTE — ED Provider Notes (Signed)
[facility] EMERGENCY DEPARTMENT Provider Note   CSN: [id number] Arrival date & time: 12/28/17  1353     History   Chief Complaint Chief Complaint  Patient presents with  . Ankle Pain    HPI Rachel Calhoun is a 22 y.o. female.  HPI   Patient is a 22 year old female with a history of anxiety, chronic back pain, degenerative disc disease, sciatic nerve pain presenting for right ankle injury.  Patient reports that 1 hour prior to arrival, she was out in the garage at her father's house, when she hit the lateral side of the right side of her ankle against a board.  Patient reports that she was unable to walk on it after the incident.  She is unsure if she rolled her ankle with inversion or eversion.  She reports that there is immediate swelling to the lateral aspect as well as the dorsum of the foot.  Patient did sustain abrasion, but tetanus shot up-to-date as of 4 years ago.  Past Medical History:  Diagnosis Date  . Anxiety   . Chronic back pain   . DDD (degenerative disc disease), lumbar   . Gallstones   . Sciatica   . Sciatica     Patient Active Problem List   Diagnosis Date Noted  . Paronychia 12/18/2012    Past Surgical History:  Procedure Laterality Date  . CHOLECYSTECTOMY    . HERNIA REPAIR    . I&D EXTREMITY Right 12/18/2012   Procedure: IRRIGATION AND DEBRIDEMENT RIGHT RING FINGER;  Surgeon: Tami Ribas, MD;  Location: [facility] OR;  Service: Orthopedics;  Laterality: Right;     OB History    Gravida  1   Para      Term      Preterm      AB      Living        SAB      TAB      Ectopic      Multiple      Live Births               Home Medications    Prior to Admission medications   Medication Sig Start Date End Date Taking? Authorizing Provider  acetaminophen (TYLENOL) 325 MG tablet Take 650 mg by mouth every 6 (six) hours as needed for mild pain or headache.    [provider]  baclofen (LIORESAL) 10 MG tablet Take 1  tablet (10 mg total) by mouth 3 (three) times daily. 05/29/16   Harris, Cammy Copa, PA-C  cephALEXin (KEFLEX) 500 MG capsule Take 1 capsule (500 mg total) by mouth 3 (three) times daily. 09/21/17   Kirichenko, Tatyana, PA-C  dicyclomine (BENTYL) 20 MG tablet Take 1 tablet (20 mg total) by mouth 2 (two) times daily. 04/26/17   Long, Arlyss Repress, MD  gabapentin (NEURONTIN) 300 MG capsule Take 1 capsule (300 mg total) by mouth 2 (two) times daily. 12/02/16   Horton, Mayer Masker, MD  ibuprofen (ADVIL,MOTRIN) 800 MG tablet Take 1 tablet (800 mg total) by mouth every 8 (eight) hours as needed. 06/20/17   Ward, Chase Picket, PA-C  loperamide (IMODIUM) 2 MG capsule Take 1 capsule (2 mg total) by mouth 4 (four) times daily as needed for diarrhea or loose stools. 04/26/17   Long, Arlyss Repress, MD  meloxicam (MOBIC) 15 MG tablet Take 1 tablet (15 mg total) by mouth daily. Take 1 daily with food. 05/29/16   Arthor Captain, PA-C  meloxicam [facility]) 15  MG tablet Take 1 tablet (15 mg total) by mouth daily. 05/29/16   Harris, Cammy CopaAbigail, PA-C  naproxen (NAPROSYN) 500 MG tablet Take 1 tablet (500 mg total) by mouth 2 (two) times daily. 12/02/16   Horton, Mayer Maskerourtney F, MD  omeprazole (PRILOSEC) 20 MG capsule Take 1 capsule (20 mg total) by mouth daily. 02/06/16   Tilden Fossaees, Elizabeth, MD  ondansetron (ZOFRAN ODT) 4 MG disintegrating tablet Take 1 tablet (4 mg total) by mouth every 8 (eight) hours as needed for nausea or vomiting. 04/26/17   Long, Arlyss RepressJoshua G, MD  sucralfate (CARAFATE) 1 GM/10ML suspension Take 10 mLs (1 g total) by mouth 4 (four) times daily -  with meals and at bedtime. 04/26/17   Long, Arlyss RepressJoshua G, MD    Family History History reviewed. No pertinent family history.  Social History Social History   Tobacco Use  . Smoking status: Former Games developermoker  . Smokeless tobacco: Never Used  Substance Use Topics  . Alcohol use: No  . Drug use: No     Allergies   Belladonna alkaloids; Clindamycin/lincomycin; and  Pb-hyoscy-atropine-scopolamine   Review of Systems Review of Systems  Musculoskeletal: Positive for arthralgias and joint swelling.  Skin: Positive for wound.  Neurological: Negative for weakness and numbness.     Physical Exam Updated Vital Signs BP 123/90 (BP Location: Left Arm)   Pulse 87   Temp 98.4 F (36.9 C) (Oral)   Resp 16   Ht 5\' 2"  (1.575 m)   Wt 99.8 kg   SpO2 99%   BMI 40.24 kg/m   Physical Exam  Constitutional: She appears well-developed and well-nourished. No distress.  Sitting comfortably in bed.  HENT:  Head: Normocephalic and atraumatic.  Eyes: Conjunctivae are normal. Right eye exhibits no discharge. Left eye exhibits no discharge.  EOMs normal to gross examination.  Neck: Normal range of motion.  Cardiovascular: Normal rate and regular rhythm.  Intact, 2+ DP and PT pulse.  Pulmonary/Chest:  Normal respiratory effort. Patient converses comfortably. No audible wheeze or stridor.  Abdominal: She exhibits no distension.  Musculoskeletal: She exhibits edema.  Right ankle with tenderness to palpation lateral malleolus. Minor soft tissue swelling. ROM decreased with inversion and eversion due to pain. No erythema, ecchymosis, or deformity appreciated. No break in skin. No pain to fifth metatarsal area or navicular region. No ecchymosis and Lisfranc pattern.  Achilles intact per Thompson's test. Good pedal pulse and cap refill of toes. Sensation intact to light touch distally.   Neurological: She is alert.  Cranial nerves intact to gross observation. Patient moves extremities without difficulty.  Skin: Skin is warm and dry. She is not diaphoretic.  Psychiatric: She has a normal mood and affect. Her behavior is normal. Judgment and thought content normal.  Nursing note and vitals reviewed.    ED Treatments / Results  Labs (all labs ordered are listed, but only abnormal results are displayed) Labs Reviewed - No data to  display  EKG None  Radiology Dg Ankle Complete Right  Result Date: 12/28/2017 CLINICAL DATA:  Right ankle pain following blunt trauma, initial encounter EXAM: RIGHT ANKLE - COMPLETE 3+ VIEW COMPARISON:  None. FINDINGS: Mild soft tissue swelling is noted laterally. No acute fracture or dislocation is noted. No other focal abnormality is seen. IMPRESSION: Lateral soft tissue swelling without acute bony abnormality. Electronically Signed   By: Alcide CleverMark  Lukens M.D.   On: 12/28/2017 15:17   Dg Foot Complete Right  Result Date: 12/28/2017 CLINICAL DATA:  Lateral foot and ankle  pain following blunt trauma, initial encounter EXAM: RIGHT FOOT COMPLETE - 3+ VIEW COMPARISON:  10/26/2003 FINDINGS: There is no evidence of fracture or dislocation. There is no evidence of arthropathy or other focal bone abnormality. Soft tissues are unremarkable. IMPRESSION: No acute abnormality noted. Electronically Signed   By: Alcide Clever M.D.   On: 12/28/2017 15:14    Procedures Procedures (including critical care time)  Medications Ordered in ED Medications  acetaminophen (TYLENOL) tablet 1,000 mg (1,000 mg Oral Given 12/28/17 1438)     Initial Impression / Assessment and Plan / ED Course  I have reviewed the triage vital signs and the nursing notes.  Pertinent labs & imaging results that were available during my care of the patient were reviewed by me and considered in my medical decision making (see chart for details).     Patient well-appearing and neurovascularly intact in the right lower extremity.  Examination assist with ankle sprain.  No laxity or ligamentous instability.  X-ray of foot and ankle without any evidence of fracture.  Patient was placed in ASO splint, had crutches from home, was instructed on weightbearing as tolerated, and RICE therapy.  Instructed him to inflammatory Tylenol use.  Patient denies any chance of pregnancy, uses nexplanon.  Discussed that ibuprofen should not be taken if  concerns about pregnancy exist or if attempt to become pregnant.  Return precautions given for any worsening pain, decreasing range of motion, pallor or paresthesias.  Patient is in understanding and agrees with plan of care.  Final Clinical Impressions(s) / ED Diagnoses   Final diagnoses:  Acute right ankle pain    ED Discharge Orders         Ordered    ibuprofen (ADVIL,MOTRIN) 800 MG tablet  Every 8 hours PRN     12/28/17 1554           Delia Chimes 12/28/17 1555    Jacalyn Lefevre, MD 12/29/17 0800

## 2017-12-28 NOTE — ED Triage Notes (Signed)
Pt c/o right ankle pain x 1 hr

## 2017-12-28 NOTE — Discharge Instructions (Signed)
Please see the information and instructions below regarding your visit.  Your diagnoses today include:  1. Acute right ankle pain    Your provider has diagnosed you as suffering from an ankle sprain. Ankle sprain occurs when the ligaments that hold the ankle joint together are stretched or torn. It may take 4 to 6 weeks to heal.  Tests performed today include: An x-ray of your ankle - does NOT show any broken bones  See side panel of your discharge paperwork for testing performed today. Vital signs are listed at the bottom of these instructions.   Medications prescribed:  Take any prescribed medications only as prescribed, and any over the counter medications only as directed on the packaging.  You are prescribed ibuprofen, a non-steroidal anti-inflammatory agent (NSAID) for pain. You may take 800 mg every 8  hours as needed for pain. If still requiring this medication around the clock for acute pain after 10 days, please see your primary healthcare provider.  Women who are pregnant, breastfeeding, or planning on becoming pregnant should not take non-steroidal anti-inflammatories such as Advil and Aleve. Tylenol is a safe over the counter pain reliever in pregnant women.  You may combine this medication with Tylenol, 650 mg every 6 hours, so you are receiving something for pain every 3 hours.  This is not a long-term medication unless under the care and direction of your primary provider. Taking this medication long-term and not under the supervision of a healthcare provider could increase the risk of stomach ulcers, kidney problems, and cardiovascular problems such as high blood pressure.    Home care instructions:  Follow R.I.C.E. Protocol: R - rest your injury  I  - use ice on injury without applying directly to skin C - compress injury with bandage or splint E - elevate the injury as much as possible  For Activity: Wear ankle brace for at least 2 weeks for stabilization of ankle.  If prescribed crutches, use crutches with non-weight bearing for the first few days. Then, you may walk on your ankle as the pain allows, or as instructed. Start gradually with weight bearing on the affected ankle. Once you can walk pain free, then try jogging. When you can run forwards, then you can try moving side-to-side. If you cannot walk without crutches in one week, you need a re-check.  Please follow any educational materials contained in this packet.   Follow-up instructions: Please follow-up with your primary care provider or the provided orthopedic (bone specialist) listed in this packet if you continue to have significant pain or trouble walking in 1 week. In this case you may have a severe sprain that requires further care.   Return instructions:  Please return if your toes are numb or tingling, appear gray or blue, are much colder than your other foot, or you have severe pain (also elevate leg and loosen splint or wrap). Please return to the Emergency Department if you experience worsening symptoms.  Please return if you have any other emergent concerns.  Additional Information:   Your vital signs today were: BP 123/90 (BP Location: Left Arm)    Pulse 87    Temp 98.4 F (36.9 C) (Oral)    Resp 16    Ht 5\' 2"  (1.575 m)    Wt 99.8 kg    SpO2 99%    BMI 40.24 kg/m  If your blood pressure (BP) was elevated on multiple readings during this visit above 130 for the top number or above 80  for the bottom number, please have this repeated by your primary care provider within one month. --------------  Thank you for allowing [place] to participate in your care today.

## 2018-01-31 ENCOUNTER — Other Ambulatory Visit: Payer: Self-pay

## 2018-01-31 ENCOUNTER — Emergency Department (HOSPITAL_BASED_OUTPATIENT_CLINIC_OR_DEPARTMENT_OTHER): Payer: Self-pay

## 2018-01-31 ENCOUNTER — Emergency Department (HOSPITAL_BASED_OUTPATIENT_CLINIC_OR_DEPARTMENT_OTHER)
Admission: EM | Admit: 2018-01-31 | Discharge: 2018-01-31 | Disposition: A | Discharge status: Home / Self Care | Payer: Self-pay | Attending: Emergency Medicine | Admitting: Emergency Medicine

## 2018-01-31 ENCOUNTER — Encounter (HOSPITAL_BASED_OUTPATIENT_CLINIC_OR_DEPARTMENT_OTHER): Payer: Self-pay

## 2018-01-31 DIAGNOSIS — R0789 Other chest pain: Secondary | ICD-10-CM | POA: Insufficient documentation

## 2018-01-31 DIAGNOSIS — F17210 Nicotine dependence, cigarettes, uncomplicated: Secondary | ICD-10-CM | POA: Insufficient documentation

## 2018-01-31 DIAGNOSIS — Z79899 Other long term (current) drug therapy: Secondary | ICD-10-CM | POA: Insufficient documentation

## 2018-01-31 MED ORDER — ALUM & MAG HYDROXIDE-SIMETH 200-200-20 MG/5ML PO SUSP
15.0000 mL | Freq: Once | ORAL | Status: AC
  Administered 2018-01-31: 15 mL via ORAL
  Filled 2018-01-31: qty 30

## 2018-01-31 MED ORDER — ACETAMINOPHEN 500 MG PO TABS
1000.0000 mg | ORAL_TABLET | Freq: Once | ORAL | Status: AC
  Administered 2018-01-31: 1000 mg via ORAL
  Filled 2018-01-31: qty 2

## 2018-01-31 MED ORDER — OXYCODONE HCL 5 MG PO TABS
5.0000 mg | ORAL_TABLET | Freq: Once | ORAL | Status: AC
Start: 2018-01-31 — End: 2018-01-31
  Administered 2018-01-31: 5 mg via ORAL
  Filled 2018-01-31: qty 1

## 2018-01-31 MED ORDER — KETOROLAC TROMETHAMINE 15 MG/ML IJ SOLN
15.0000 mg | Freq: Once | INTRAMUSCULAR | Status: AC
  Administered 2018-01-31: 15 mg via INTRAMUSCULAR
  Filled 2018-01-31: qty 1

## 2018-01-31 MED ORDER — DIAZEPAM 5 MG PO TABS
5.0000 mg | ORAL_TABLET | Freq: Once | ORAL | Status: AC
  Administered 2018-01-31: 5 mg via ORAL
  Filled 2018-01-31: qty 1

## 2018-01-31 NOTE — ED Notes (Signed)
Pts family asked RN when a room would be avaliable. RN explained to patient and family patinets were called to rooms. Mother asked" [facility] with chest pain back like that". RN informed patient and mother that we could not dictate what they did but we would be happy to see them. Will continue to monitor

## 2018-01-31 NOTE — ED Notes (Signed)
pts mother came to desk stating that her daughters condition was worsening. RN assessed patient and check vital signs ( all were in defined limits). RN went to go get an ice pack per patient's mothers request. On return mother was having a hard time arousing patient. Patient was still attached to vital sign monitor and O2 sat was 98% and pulse was 99BPM blood pressure was charted. Spoke with Consulting civil engineerCharge RN and patient got placed in a stretcher.

## 2018-01-31 NOTE — ED Notes (Signed)
Patient transported to X-ray 

## 2018-01-31 NOTE — Discharge Instructions (Addendum)
Take 4 over the counter ibuprofen tablets 3 times a day or 2 over-the-counter naproxen tablets twice a day for pain. Also take tylenol 1000mg(2 extra strength) four times a day.   Try zantac or pepcid twice a day.  Try to avoid things that may make this worse, most commonly these are spicy foods tomato based products fatty foods chocolate and peppermint.  Alcohol and tobacco can also make this worse.  Return to the emergency department for sudden worsening pain fever or inability to eat or drink.    

## 2018-01-31 NOTE — ED Triage Notes (Signed)
Per EMS, patient c/o worsening anxiety; panic attack at present X 1 day.

## 2018-01-31 NOTE — ED Notes (Signed)
Getting meds; try to get xrays in 15-20 min per charge RN

## 2018-02-01 NOTE — ED Provider Notes (Signed)
[facility] EMERGENCY DEPARTMENT Provider Note   CSN: [id number] Arrival date & time: 01/31/18  2030     [id number]   Chief Complaint Chief Complaint  Patient presents with  . Panic Attack    HPI Rachel Calhoun is a 22 y.o. female.  22 yo F with a chief complaint of chest pain.  This been going on for the past 6 hours or so.  Described as sharp and severe.  Worse with movement palpation or twisting.  She denies trauma denies cough congestion or fever denies history of PE or DVT.  Denies lower extremity edema denies hemoptysis denies recent surgery or immobilization.  Denies estrogen use.  She is a current smoker.  The history is provided by the patient and a parent.  Chest Pain   This is a new problem. The current episode started 6 to 12 hours ago. The problem occurs constantly. The problem has not changed since onset.The pain is associated with movement. The pain is present in the substernal region. The pain is at a severity of 10/10. The pain is severe. The quality of the pain is described as sharp. The pain does not radiate. Pertinent negatives include no dizziness, no fever, no headaches, no nausea, no palpitations, no shortness of breath and no vomiting. She has tried nothing for the symptoms. The treatment provided no relief.  Pertinent negatives for past medical history include no DVT, no hyperlipidemia, no hypertension, no MI and no PE.  Pertinent negatives for family medical history include: no early MI and no PE.    Past Medical History:  Diagnosis Date  . Anxiety   . Chronic back pain   . DDD (degenerative disc disease), lumbar   . Gallstones   . Sciatica   . Sciatica     Patient Active Problem List   Diagnosis Date Noted  . Paronychia 12/18/2012    Past Surgical History:  Procedure Laterality Date  . CHOLECYSTECTOMY    . HERNIA REPAIR    . I&D EXTREMITY Right 12/18/2012   Procedure: IRRIGATION AND DEBRIDEMENT RIGHT RING FINGER;  Surgeon: Tami RibasKevin R  Kuzma, MD;  Location: [facility] OR;  Service: Orthopedics;  Laterality: Right;     OB History    Gravida  1   Para      Term      Preterm      AB      Living        SAB      TAB      Ectopic      Multiple      Live Births               Home Medications    Prior to Admission medications   Medication Sig Start Date End Date Taking? Authorizing Provider  acetaminophen (TYLENOL) 325 MG tablet Take 650 mg by mouth every 6 (six) hours as needed for mild pain or headache.    [provider]  baclofen (LIORESAL) 10 MG tablet Take 1 tablet (10 mg total) by mouth 3 (three) times daily. 05/29/16   Harris, Cammy CopaAbigail, PA-C  cephALEXin (KEFLEX) 500 MG capsule Take 1 capsule (500 mg total) by mouth 3 (three) times daily. 09/21/17   Kirichenko, Tatyana, PA-C  dicyclomine (BENTYL) 20 MG tablet Take 1 tablet (20 mg total) by mouth 2 (two) times daily. 04/26/17   Long, Arlyss RepressJoshua G, MD  gabapentin (NEURONTIN) 300 MG capsule Take 1 capsule (300 mg total) by mouth 2 (two) times daily.  12/02/16   Horton, Mayer Maskerourtney F, MD  ibuprofen (ADVIL,MOTRIN) 800 MG tablet Take 1 tablet (800 mg total) by mouth every 8 (eight) hours as needed. 12/28/17   Aviva KluverMurray, Alyssa B, PA-C  loperamide (IMODIUM) 2 MG capsule Take 1 capsule (2 mg total) by mouth 4 (four) times daily as needed for diarrhea or loose stools. 04/26/17   Long, Arlyss RepressJoshua G, MD  meloxicam (MOBIC) 15 MG tablet Take 1 tablet (15 mg total) by mouth daily. Take 1 daily with food. 05/29/16   Arthor CaptainHarris, Abigail, PA-C  meloxicam (MOBIC) 15 MG tablet Take 1 tablet (15 mg total) by mouth daily. 05/29/16   Harris, Cammy CopaAbigail, PA-C  naproxen (NAPROSYN) 500 MG tablet Take 1 tablet (500 mg total) by mouth 2 (two) times daily. 12/02/16   Horton, Mayer Maskerourtney F, MD  omeprazole (PRILOSEC) 20 MG capsule Take 1 capsule (20 mg total) by mouth daily. 02/06/16   Tilden Fossaees, Elizabeth, MD  ondansetron (ZOFRAN ODT) 4 MG disintegrating tablet Take 1 tablet (4 mg total) by mouth every 8 (eight)  hours as needed for nausea or vomiting. 04/26/17   Long, Arlyss RepressJoshua G, MD  sucralfate (CARAFATE) 1 GM/10ML suspension Take 10 mLs (1 g total) by mouth 4 (four) times daily -  with meals and at bedtime. 04/26/17   Long, Arlyss RepressJoshua G, MD    Family History No family history on file.  Social History Social History   Tobacco Use  . Smoking status: Current Some Day Smoker    Types: Cigarettes  . Smokeless tobacco: Never Used  Substance Use Topics  . Alcohol use: No  . Drug use: No     Allergies   Belladonna alkaloids; Clindamycin/lincomycin; and Pb-hyoscy-atropine-scopolamine   Review of Systems Review of Systems  Constitutional: Negative for chills and fever.  HENT: Negative for congestion and rhinorrhea.   Eyes: Negative for redness and visual disturbance.  Respiratory: Negative for shortness of breath and wheezing.   Cardiovascular: Positive for chest pain. Negative for palpitations.  Gastrointestinal: Negative for nausea and vomiting.  Genitourinary: Negative for dysuria and urgency.  Musculoskeletal: Negative for arthralgias and myalgias.  Skin: Negative for pallor and wound.  Neurological: Negative for dizziness and headaches.     Physical Exam Updated Vital Signs BP 124/78   Pulse 84   Resp 17   Ht 5\' 2"  (1.575 m)   Wt 98.4 kg   SpO2 99%   BMI 39.69 kg/m   Physical Exam Vitals signs and nursing note reviewed.  Constitutional:      General: She is not in acute distress.    Appearance: She is well-developed. She is obese. She is not diaphoretic.     Comments: Patient is screaming and moving back and forth on the stretcher  HENT:     Head: Normocephalic and atraumatic.  Eyes:     Pupils: Pupils are equal, round, and reactive to light.  Neck:     Musculoskeletal: Normal range of motion and neck supple.  Cardiovascular:     Rate and Rhythm: Normal rate and regular rhythm.     Heart sounds: No murmur. No friction rub. No gallop.   Pulmonary:     Effort: Pulmonary  effort is normal.     Breath sounds: No wheezing or rales.  Chest:     Chest wall: Tenderness (palpation diffusely about the chest wall reproduces tenderness) present.  Abdominal:     General: There is no distension.     Palpations: Abdomen is soft.     Tenderness: There  is no abdominal tenderness.  Musculoskeletal:        General: No tenderness.  Skin:    General: Skin is warm and dry.  Neurological:     Mental Status: She is alert and oriented to person, place, and time.  Psychiatric:        Behavior: Behavior normal.      ED Treatments / Results  Labs (all labs ordered are listed, but only abnormal results are displayed) Labs Reviewed - No data to display  EKG EKG Interpretation  Date/Time:  Sunday January 31 2018 21:25:53 EST Ventricular Rate:  95 PR Interval:    QRS Duration: 86 QT Interval:  337 QTC Calculation: 424 R Axis:   55 Text Interpretation:  Sinus rhythm Rate slower Otherwise no significant change Confirmed by Ryosuke Ericksen ([id number]) on 01/31/2018 9:52:37 PM   Radiology Dg Chest 2 View  Result Date: 01/31/2018 CLINICAL DATA:  Acute onset of generalized chest pain. Panic attack. EXAM: CHEST - 2 VIEW COMPARISON:  Chest radiograph and CTA of the chest performed 04/26/2017 FINDINGS: The lungs are well-aerated and clear. There is no evidence of focal opacification, pleural effusion or pneumothorax. The heart is normal in size; the mediastinal contour is within normal limits. No acute osseous abnormalities are seen. IMPRESSION: No acute cardiopulmonary process seen. Electronically Signed   By: Jeffery  Chang M.D.   On: 01/31/2018 22:48    Procedures Procedures (including critical care time)  Medications Ordered in ED Medications  acetaminophen (TYLENOL) tablet 1,000 mg (1,000 mg Oral Given 01/31/18 2154)  ketorolac (TORADOL) 15 MG/ML injection 15 mg (15 mg Intramuscular Given 01/31/18 2154)  oxyCODONE (Oxy IR/ROXICODONE) immediate release tablet 5 mg (5 mg  Oral Given 01/31/18 2154)  diazepam (VALIUM) tablet 5 mg (5 mg Oral Given 01/31/18 2154)  alum & mag hydroxide-simeth (MAALOX/MYLANTA) 200-200-20 MG/5ML suspension 15 mL (15 mLs Oral Given 01/31/18 2154)     Initial Impression / Assessment and Plan / ED Course  I have reviewed the triage vital signs and the nursing notes.  Pertinent labs & imaging results that were available during my care of the patient were reviewed by me and considered in my medical decision making (see chart for details).     22  yo F with a chief complaint of chest pain.  Going on for about 6 hours.  Reproducible on exam.  I feel this is completely atypical of ACS.  I feel this is extremely unlikely to be a PE.  Chest x-ray viewed by me without focal infiltrates or pneumothorax.  EKG without concerning finding.  Patient feeling much better on symptomatic therapy.  Discharge home.  12:24 AM:  I have discussed the diagnosis/risks/treatment options with the patient and family and believe the pt to be eligible for discharge home to follow-up with PCP. We also discussed returning to the ED immediately if new or worsening sx occur. We discussed the sx which are most concerning (e.g., sudden worsening pain, fever, inability to tolerate by mouth) that necessitate immediate return. Medications administered to the patient during their visit and any new prescriptions provided to the patient are listed below.  Medications given during this visit Medications  acetaminophen (TYLENOL) tablet 1,000 mg (1,000 mg Oral Given 01/31/18 2154)  ketorolac (TORADOL) 15 MG/ML injection 15 mg (15 mg Intramuscular Given 01/31/18 2154)  oxyCODONE (Oxy IR/ROXICODONE) immediate release tablet 5 mg (5 mg Oral Given 01/31/18 2154)  diazepam (VALIUM) tablet 5 mg (5 mg Oral Given 01/31/18 2154)  alum & mag hydroxide-simeth (  MAALOX/MYLANTA) 200-200-20 MG/5ML suspension 15 mL (15 mLs Oral Given 01/31/18 2154)      The patient appears reasonably screen  and/or stabilized for discharge and I doubt any other medical condition or other [facility] requiring further screening, evaluation, or treatment in the ED at this time prior to discharge.    Final Clinical Impressions(s) / ED Diagnoses   Final diagnoses:  Atypical chest pain    ED Discharge Orders    None       Melene Plan, DO 02/01/18 0024

## 2018-03-22 ENCOUNTER — Other Ambulatory Visit: Payer: Self-pay

## 2018-03-22 ENCOUNTER — Encounter (HOSPITAL_BASED_OUTPATIENT_CLINIC_OR_DEPARTMENT_OTHER): Payer: Self-pay

## 2018-03-22 ENCOUNTER — Emergency Department (HOSPITAL_BASED_OUTPATIENT_CLINIC_OR_DEPARTMENT_OTHER)
Admission: EM | Admit: 2018-03-22 | Discharge: 2018-03-23 | Disposition: A | Discharge status: Home / Self Care | Payer: Medicaid Other | Attending: Emergency Medicine | Admitting: Emergency Medicine

## 2018-03-22 DIAGNOSIS — X102XXA Contact with fats and cooking oils, initial encounter: Secondary | ICD-10-CM | POA: Insufficient documentation

## 2018-03-22 DIAGNOSIS — L03113 Cellulitis of right upper limb: Secondary | ICD-10-CM | POA: Insufficient documentation

## 2018-03-22 DIAGNOSIS — T22211A Burn of second degree of right forearm, initial encounter: Secondary | ICD-10-CM

## 2018-03-22 DIAGNOSIS — Z79899 Other long term (current) drug therapy: Secondary | ICD-10-CM | POA: Diagnosis not present

## 2018-03-22 DIAGNOSIS — F17210 Nicotine dependence, cigarettes, uncomplicated: Secondary | ICD-10-CM | POA: Insufficient documentation

## 2018-03-22 DIAGNOSIS — Y999 Unspecified external cause status: Secondary | ICD-10-CM | POA: Diagnosis not present

## 2018-03-22 DIAGNOSIS — Z23 Encounter for immunization: Secondary | ICD-10-CM | POA: Insufficient documentation

## 2018-03-22 DIAGNOSIS — Y929 Unspecified place or not applicable: Secondary | ICD-10-CM | POA: Diagnosis not present

## 2018-03-22 DIAGNOSIS — Y939 Activity, unspecified: Secondary | ICD-10-CM | POA: Insufficient documentation

## 2018-03-22 DIAGNOSIS — T22011A Burn of unspecified degree of right forearm, initial encounter: Secondary | ICD-10-CM | POA: Diagnosis present
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
## 2018-03-23 ENCOUNTER — Encounter (HOSPITAL_BASED_OUTPATIENT_CLINIC_OR_DEPARTMENT_OTHER): Payer: Self-pay | Admitting: Emergency Medicine

## 2018-03-23 MED ORDER — TETANUS-DIPHTH-ACELL PERTUSSIS 5-2.5-18.5 LF-MCG/0.5 IM SUSP
0.5000 mL | Freq: Once | INTRAMUSCULAR | Status: AC
  Administered 2018-03-23: 0.5 mL via INTRAMUSCULAR
  Filled 2018-03-23: qty 0.5

## 2018-03-23 MED ORDER — CEPHALEXIN 250 MG PO CAPS
500.0000 mg | ORAL_CAPSULE | Freq: Once | ORAL | Status: AC
  Administered 2018-03-23: 500 mg via ORAL
  Filled 2018-03-23: qty 2

## 2018-03-23 MED ORDER — FLUCONAZOLE 150 MG PO TABS: ORAL_TABLET | ORAL | 0 refills | Status: DC

## 2018-03-23 MED ORDER — CEPHALEXIN 500 MG PO CAPS: 500.0000 mg | ORAL_CAPSULE | Freq: Four times a day (QID) | ORAL | 0 refills | Status: DC

## 2018-03-23 NOTE — ED Notes (Signed)
Patient is A&Ox4.  No signs of distress noted.  Please see providers complete history and physical exam.  
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
## 2018-03-23 NOTE — ED Notes (Signed)
PT states understanding of care given, follow up care, and medication prescribed. PT ambulated from ED to car with a steady gait. 

## 2018-03-29 ENCOUNTER — Encounter (HOSPITAL_COMMUNITY): Payer: Self-pay | Admitting: Emergency Medicine

## 2018-03-29 ENCOUNTER — Emergency Department (HOSPITAL_COMMUNITY)
Admission: EM | Admit: 2018-03-29 | Discharge: 2018-03-29 | Disposition: A | Discharge status: Home / Self Care | Payer: Medicaid Other | Attending: Emergency Medicine | Admitting: Emergency Medicine

## 2018-03-29 ENCOUNTER — Other Ambulatory Visit: Payer: Self-pay

## 2018-03-29 DIAGNOSIS — J069 Acute upper respiratory infection, unspecified: Secondary | ICD-10-CM | POA: Diagnosis not present

## 2018-03-29 DIAGNOSIS — B9789 Other viral agents as the cause of diseases classified elsewhere: Secondary | ICD-10-CM

## 2018-03-29 DIAGNOSIS — J029 Acute pharyngitis, unspecified: Secondary | ICD-10-CM

## 2018-03-29 DIAGNOSIS — F17210 Nicotine dependence, cigarettes, uncomplicated: Secondary | ICD-10-CM | POA: Insufficient documentation

## 2018-03-29 DIAGNOSIS — J020 Streptococcal pharyngitis: Secondary | ICD-10-CM | POA: Diagnosis present

## 2018-03-29 LAB — GROUP A STREP BY PCR: Group A Strep by PCR: NOT DETECTED

## 2018-03-29 MED ORDER — BENZONATATE 100 MG PO CAPS: 100.0000 mg | ORAL_CAPSULE | Freq: Three times a day (TID) | ORAL | 0 refills | Status: DC

## 2018-03-29 MED ORDER — ACETAMINOPHEN 500 MG PO TABS
1000.0000 mg | ORAL_TABLET | Freq: Once | ORAL | Status: AC
  Administered 2018-03-29: 1000 mg via ORAL
  Filled 2018-03-29: qty 2

## 2018-03-29 NOTE — ED Triage Notes (Signed)
Pt. Stated, Rachel Calhoun been congestion with a sore throat and now it feels like I have a lump in my throat.

## 2018-03-29 NOTE — Discharge Instructions (Signed)
You can take Tylenol or Ibuprofen as directed for pain. You can alternate Tylenol and Ibuprofen every 4 hours. If you take Tylenol at 1pm, then you can take Ibuprofen at 5pm. Then you can take Tylenol again at 9pm.   Take Tessalon perles as directed.   Make sure are drinking plenty of fluids and staying hydrated.   Follow-up with [facility] to establish a primary care doctor if you do not have one.   Return to the Emergency Department for any high fever despite medications, vomiting, difficulty breathing.

## 2018-03-29 NOTE — ED Notes (Signed)
E-signature not available, verbalized understanding of DC instructions and prescriptions.  

## 2018-03-29 NOTE — ED Provider Notes (Signed)
[facility] EMERGENCY DEPARTMENT Provider Note   CSN: [id number] Arrival date & time: 03/29/18  1848    History   Chief Complaint Chief Complaint  Patient presents with  . Nasal Congestion  . Sore Throat    HPI Rachel Calhoun is a 23 y.o. female with past medical history of chronic back pain, degenerative disc disease, gallstones, sciatica who presents for evaluation of sore throat, nasal congestion, cough that is been ongoing for last week.  Patient states that she has had a progressively worsening sore throat, prompting ED visit.  She reports taking 1 dose of ibuprofen yesterday with no improvement in symptoms.  She states she has not tried any other medications.  She states she is still able to tolerate her secretions and p.o. but does report worsening pain with doing so.  She states "feels like there is a lump that is causing it to hurt."  She states she has not measured any fever.  She reports some associated nasal congestion, rhinorrhea.  She states that she has had some dry cough.  Patient states that she has had sick contacts.     The history is provided by the patient.    Past Medical History:  Diagnosis Date  . Anxiety   . Chronic back pain   . DDD (degenerative disc disease), lumbar   . Gallstones   . Sciatica     Patient Active Problem List   Diagnosis Date Noted  . Paronychia 12/18/2012    Past Surgical History:  Procedure Laterality Date  . CHOLECYSTECTOMY    . HERNIA REPAIR    . I&D EXTREMITY Right 12/18/2012   Procedure: IRRIGATION AND DEBRIDEMENT RIGHT RING FINGER;  Surgeon: Tami Ribas, MD;  Location: [facility] OR;  Service: Orthopedics;  Laterality: Right;     OB History    Gravida  1   Para      Term      Preterm      AB      Living        SAB      TAB      Ectopic      Multiple      Live Births               Home Medications    Prior to Admission medications   Medication Sig Start Date End Date Taking?  Authorizing Provider  baclofen (LIORESAL) 10 MG tablet Take 1 tablet (10 mg total) by mouth 3 (three) times daily. 05/29/16   Harris, Abigail, PA-C  benzonatate (TESSALON) 100 MG capsule Take 1 capsule (100 mg total) by mouth every 8 (eight) hours. 03/29/18   Maxwell Caul, PA-C  benzonatate (TESSALON) 100 MG capsule Take 1 capsule (100 mg total) by mouth every 8 (eight) hours. 03/29/18   Maxwell Caul, PA-C  cephALEXin (KEFLEX) 500 MG capsule Take 1 capsule (500 mg total) by mouth 4 (four) times daily. 03/23/18   Molpus, John, MD  fluconazole (DIFLUCAN) 150 MG tablet Take 1 tablet as needed for vaginal yeast infection.  May repeat in 3 days if symptoms persist. 03/23/18   Molpus, Jonny Ruiz, MD  gabapentin (NEURONTIN) 300 MG capsule Take 1 capsule (300 mg total) by mouth 2 (two) times daily. 12/02/16   Horton, Mayer Masker, MD    Family History No family history on file.  Social History Social History   Tobacco Use  . Smoking status: Current Some Day Smoker    Types: Cigarettes  .  Smokeless tobacco: Never Used  Substance Use Topics  . Alcohol use: Yes    Comment: occ  . Drug use: No     Allergies   Belladonna alkaloids and Clindamycin/lincomycin   Review of Systems Review of Systems  Constitutional: Negative for fever.  HENT: Positive for congestion, postnasal drip, rhinorrhea and sore throat. Negative for drooling and trouble swallowing.   Respiratory: Positive for cough. Negative for shortness of breath.   Cardiovascular: Negative for chest pain.  Gastrointestinal: Negative for abdominal pain, nausea and vomiting.  Neurological: Negative for headaches.  All other systems reviewed and are negative.    Physical Exam Updated Vital Signs BP 123/71 (BP Location: Right Arm)   Pulse 87   Temp 98.7 F (37.1 C) (Oral)   Resp 16   SpO2 97%   Physical Exam Vitals signs and nursing note reviewed.  Constitutional:      Appearance: She is well-developed.  HENT:     Head:  Normocephalic and atraumatic.     Nose: Congestion present.     Mouth/Throat:     Pharynx: Uvula midline. Posterior oropharyngeal erythema present. No oropharyngeal exudate.     Comments: Airway is patent, phonation is intact. Uvula is midline.  No trismus.  Slight posterior oropharynx erythema.  No edema.  No exudates.   Eyes:     General: No scleral icterus.       Right eye: No discharge.        Left eye: No discharge.     Conjunctiva/sclera: Conjunctivae normal.  Pulmonary:     Effort: Pulmonary effort is normal.     Comments: Lungs clear to auscultation bilaterally.  Symmetric chest rise.  No wheezing, rales, rhonchi. Lymphadenopathy:     Cervical: No cervical adenopathy.  Skin:    General: Skin is warm and dry.  Neurological:     Mental Status: She is alert.  Psychiatric:        Speech: Speech normal.        Behavior: Behavior normal.      ED Treatments / Results  Labs (all labs ordered are listed, but only abnormal results are displayed) Labs Reviewed  GROUP A STREP BY PCR    EKG None  Radiology No results found.  Procedures Procedures (including critical care time)  Medications Ordered in ED Medications  acetaminophen (TYLENOL) tablet 1,000 mg (1,000 mg Oral Given 03/29/18 2035)     Initial Impression / Assessment and Plan / ED Course  I have reviewed the triage vital signs and the nursing notes.  Pertinent labs & imaging results that were available during my care of the patient were reviewed by me and considered in my medical decision making (see chart for details).        23 year old female who presents for evaluation of 1 week of progressively worsening sore throat.  Associated nasal congestion, rhinorrhea, cough.  No fevers.  Still able to tolerate secretions but does report worsening pain with swallowing.  No difficulty breathing. Patient is afebrile, non-toxic appearing, sitting comfortably on examination table. Vital signs reviewed and stable.  On  exam, slight posterior oropharynx erythema.  No edema.  Uvula is midline.  No exudates.  History/physical exam not concerning for Ludwig angina peritonsillar abscess.  Consider pharyngitis versus tonsillitis versus postnasal drip.  Will plan for strep.  Strep reviewed and negative.  Discussed results with patient.  Patient concerned that it may be influenza.  I discussed with patient that her symptoms are more likely of high/postnasal  drip.  Additionally, patient symptoms have been ongoing for several days.  He is outside the window for possible Tamiflu treatment and therefore testing for flu would not change course of discharge.  Vital signs reviewed.  Patient tolerating secretions without any difficulty. At this time, patient exhibits no emergent life-threatening condition that require further evaluation in ED or admission. Patient had ample opportunity for questions and discussion. All patient's questions were answered with full understanding. Strict return precautions discussed. Patient expresses understanding and agreement to plan.   Portions of this note were generated with Scientist, clinical (histocompatibility and immunogenetics). Dictation errors may occur despite best attempts at proofreading.    Final Clinical Impressions(s) / ED Diagnoses   Final diagnoses:  Viral URI with cough  Sore throat    ED Discharge Orders         Ordered    benzonatate (TESSALON) 100 MG capsule  Every 8 hours     03/29/18 2118    benzonatate (TESSALON) 100 MG capsule  Every 8 hours     03/29/18 2132           Maxwell Caul, PA-C 03/29/18 2335    Pricilla Loveless, MD 04/02/18 1622

## 2018-07-08 ENCOUNTER — Encounter (HOSPITAL_BASED_OUTPATIENT_CLINIC_OR_DEPARTMENT_OTHER): Payer: Self-pay | Admitting: *Deleted

## 2018-07-08 ENCOUNTER — Emergency Department (HOSPITAL_BASED_OUTPATIENT_CLINIC_OR_DEPARTMENT_OTHER)
Admission: EM | Admit: 2018-07-08 | Discharge: 2018-07-08 | Disposition: A | Discharge status: Home / Self Care | Payer: Medicaid Other | Attending: Emergency Medicine | Admitting: Emergency Medicine

## 2018-07-08 ENCOUNTER — Emergency Department (HOSPITAL_BASED_OUTPATIENT_CLINIC_OR_DEPARTMENT_OTHER): Payer: Medicaid Other

## 2018-07-08 ENCOUNTER — Other Ambulatory Visit: Payer: Self-pay

## 2018-07-08 DIAGNOSIS — Z79899 Other long term (current) drug therapy: Secondary | ICD-10-CM | POA: Diagnosis not present

## 2018-07-08 DIAGNOSIS — W010XXA Fall on same level from slipping, tripping and stumbling without subsequent striking against object, initial encounter: Secondary | ICD-10-CM | POA: Diagnosis not present

## 2018-07-08 DIAGNOSIS — M25562 Pain in left knee: Secondary | ICD-10-CM | POA: Insufficient documentation

## 2018-07-08 DIAGNOSIS — W19XXXA Unspecified fall, initial encounter: Secondary | ICD-10-CM

## 2018-07-08 DIAGNOSIS — F17210 Nicotine dependence, cigarettes, uncomplicated: Secondary | ICD-10-CM | POA: Diagnosis not present

## 2018-07-08 MED ORDER — HYDROCODONE-ACETAMINOPHEN 5-325 MG PO TABS: 1.0000 | ORAL_TABLET | Freq: Four times a day (QID) | ORAL | 0 refills | Status: AC | PRN

## 2018-07-08 NOTE — ED Notes (Signed)
Patient transported to X-ray 

## 2018-07-08 NOTE — ED Provider Notes (Signed)
[facility] EMERGENCY DEPARTMENT Provider Note   CSN: [id number] Arrival date & time: 07/08/18  1541   History   Chief Complaint Chief Complaint  Patient presents with  . Fall    HPI Rachel Calhoun is a 23 y.o. female past medical history significant for chronic back pain, anxiety, degenerative disc disease, recurrent left knee dislocation who presents for evaluation after fall.  Patient states she was walking on a wet vinyl floor when she slipped and fell and heard a pop to her left knee.  Patient states she felt like the front portion of her knee went over to the left.  She felt immediate pain to the anterior portion of her knee.  She was unable to bend her knee without significant pain after the injury.  She denies hitting her head or LOC.  She denies losing consciousness.  Denies pain to her lower back, femur, tib/fib/ankle, numbness or tingling to her extremities, swelling, redness or warmth.  She rates her current pain a 10/10.  Pain located to anterior portion of her knee.  Patient was given 100 MCG fentanyl from EMS for pain prior to arrival.  Denies additional aggravating or alleviating factors.  She has not followed with orthopedics previously for recurrent knee dislocations.  History obtained from patient.  No interpreter was used.     HPI  Past Medical History:  Diagnosis Date  . Anxiety   . Chronic back pain   . DDD (degenerative disc disease), lumbar   . Gallstones   . Sciatica     Patient Active Problem List   Diagnosis Date Noted  . Paronychia 12/18/2012    Past Surgical History:  Procedure Laterality Date  . CHOLECYSTECTOMY    . HERNIA REPAIR    . I&D EXTREMITY Right 12/18/2012   Procedure: IRRIGATION AND DEBRIDEMENT RIGHT RING FINGER;  Surgeon: Tami Ribas, MD;  Location: [facility] OR;  Service: Orthopedics;  Laterality: Right;     OB History    Gravida  1   Para      Term      Preterm      AB      Living        SAB      TAB      Ectopic      Multiple      Live Births               Home Medications    Prior to Admission medications   Medication Sig Start Date End Date Taking? Authorizing Provider  baclofen (LIORESAL) 10 MG tablet Take 1 tablet (10 mg total) by mouth 3 (three) times daily. 05/29/16   Harris, Abigail, PA-C  benzonatate (TESSALON) 100 MG capsule Take 1 capsule (100 mg total) by mouth every 8 (eight) hours. 03/29/18   Maxwell Caul, PA-C  benzonatate (TESSALON) 100 MG capsule Take 1 capsule (100 mg total) by mouth every 8 (eight) hours. 03/29/18   Maxwell Caul, PA-C  gabapentin (NEURONTIN) 300 MG capsule Take 1 capsule (300 mg total) by mouth 2 (two) times daily. 12/02/16   Horton, Mayer Masker, MD  HYDROcodone-acetaminophen (NORCO/VICODIN) 5-325 MG tablet Take 1 tablet by mouth every 6 (six) hours as needed for up to 3 days for severe pain. 07/08/18 07/11/18  Arik Husmann A, PA-C    Family History History reviewed. No pertinent family history.  Social History Social History   Tobacco Use  . Smoking status: Current Some Day Smoker  Types: Cigarettes  . Smokeless tobacco: Never Used  Substance Use Topics  . Alcohol use: Yes    Comment: occ  . Drug use: No     Allergies   Belladonna alkaloids and Clindamycin/lincomycin   Review of Systems Review of Systems  Constitutional: Negative.   HENT: Negative.   Respiratory: Negative.   Cardiovascular: Negative.   Gastrointestinal: Negative.   Genitourinary: Negative.   Musculoskeletal:       Left knee pain.  Skin: Negative.   Neurological: Negative.   All other systems reviewed and are negative.    Physical Exam Updated Vital Signs BP 125/83 (BP Location: Right Arm)   Pulse 80   Temp 98.3 F (36.8 C) (Oral)   Resp 20   Ht 5\' 6"  (1.676 m)   Wt 95.3 kg   SpO2 98%   BMI 33.89 kg/m   Physical Exam Vitals signs and nursing note reviewed.  Constitutional:      General: She is not in acute distress.    Appearance:  She is well-developed. She is not ill-appearing, toxic-appearing or diaphoretic.  HENT:     Head: Normocephalic and atraumatic.     Nose: Nose normal.     Mouth/Throat:     Mouth: Mucous membranes are moist.     Pharynx: Oropharynx is clear.  Eyes:     Pupils: Pupils are equal, round, and reactive to light.  Neck:     Musculoskeletal: Normal range of motion.  Cardiovascular:     Rate and Rhythm: Normal rate.     Pulses: Normal pulses.          Dorsalis pedis pulses are 2+ on the right side and 2+ on the left side.       Posterior tibial pulses are 2+ on the right side and 2+ on the left side.     Heart sounds: Normal heart sounds. No murmur. No friction rub. No gallop.   Pulmonary:     Effort: Pulmonary effort is normal. No respiratory distress.     Breath sounds: Normal breath sounds. No stridor. No wheezing, rhonchi or rales.  Chest:     Chest wall: No tenderness.  Abdominal:     General: Bowel sounds are normal. There is no distension.     Tenderness: There is no abdominal tenderness. There is no right CVA tenderness, left CVA tenderness, guarding or rebound.     Hernia: No hernia is present.  Musculoskeletal:     Right hip: Normal.     Left hip: Normal.     Right knee: Normal.     Left knee: She exhibits decreased range of motion. She exhibits no swelling, no effusion, no ecchymosis, no deformity, no laceration, no erythema, normal alignment, no LCL laxity, normal patellar mobility, no bony tenderness, normal meniscus and no MCL laxity. Tenderness found. Lateral joint line and patellar tendon tenderness noted. No medial joint line, no MCL and no LCL tenderness noted.     Right ankle: Normal.     Left ankle: Normal.     Lumbar back: Normal.     Right upper leg: Normal.     Left upper leg: Normal.     Right lower leg: Normal.     Left lower leg: Normal.       Legs:     Right foot: Normal. Normal range of motion. No deformity.     Left foot: Normal. Normal range of motion.  No deformity.     Comments: No midline spinal  tenderness to palpation.  Pelvis stable and nontender to pressure.  She has no tenderness over her bilateral femurs, bilateral tib/fib, ankles.  She is able to wiggle toes without difficulty.  Tenderness to anterior and lateral portion over lateral joint line to left knee.  She is able to straight leg raise without difficulty.  She is unable to actively flex at her left knee secondary to pain.  She is able to passively flex and extend at the left knee.  Negative anterior drawer, negative varus valgus stress bilaterally.  Patella does not appear subluxated.  2+ DP, PT pulses bilaterally.  Normal color and sensation to lower extremities.  Bilateral extremity compartments are soft.   Feet:     Right foot:     Protective Sensation: 3 sites tested. 3 sites sensed.     Skin integrity: Skin integrity normal.     Left foot:     Protective Sensation: 3 sites tested. 3 sites sensed.     Skin integrity: Skin integrity normal.  Skin:    General: Skin is warm and dry.     Comments: No edema, erythema, ecchymosis or warmth.  No rashes or lesions.  She has no obvious joint effusions.  Brisk capillary refill.  Neurological:     Mental Status: She is alert.     Sensory: Sensation is intact.     Motor: Motor function is intact.     Coordination: Coordination is intact.     Deep Tendon Reflexes: Reflexes are normal and symmetric.     Comments: Intact sensation to sharp and dull bilateral lower extremities.  Unable to perform active flexion and extension of left knee secondary to pain. 5/5 strength with plantar flexion and dorsiflexion to bilateral ankles.  Unable to attempt to ambulate secondary to pain to the left knee.    ED Treatments / Results  Labs (all labs ordered are listed, but only abnormal results are displayed) Labs Reviewed - No data to display  EKG None  Radiology Dg Knee Complete 4 Views Left  Result Date: 07/08/2018 CLINICAL DATA:  Left knee  pain after fall. EXAM: LEFT KNEE - COMPLETE 4+ VIEW COMPARISON:  None. FINDINGS: No evidence of fracture, dislocation, or joint effusion. No evidence of arthropathy or other focal bone abnormality. Soft tissues are unremarkable. IMPRESSION: Negative. Electronically Signed   By: Lupita Raider M.D.   On: 07/08/2018 16:39    Procedures Procedures (including critical care time)  Medications Ordered in ED Medications - No data to display  Initial Impression / Assessment and Plan / ED Course  I have reviewed the triage vital signs and the nursing notes.  Pertinent labs & imaging results that were available during my care of the patient were reviewed by me and considered in my medical decision making (see chart for details).  23 year old female appears otherwise well presents for evaluation of left knee pain after mechanical fall.  She did not hit her head or have LOC.  Denies anticoagulation.  Patient with history of recurrent left knee dislocations.  She is not followed with orthopedics for this.  Patient unable to perform active flexion extension of left knee secondary to pain.  She is able to perform passive flexion extension without difficulty.  She has full range of motion to her bilateral hips and ankles.  She has no tenderness of her her spine with normal range of motion.  She has negative varus, valgus stress, anterior drawer.  She does have tenderness to her lateral joint  line and anterior patella.  The patella does not appear subluxated.  She has 2+, PT pulses bilaterally.  She has good color and sensation to her bilateral lower extremities.  She has no tenderness over bilateral femurs.  She is able to perform straight leg raise bilaterally without difficulty.  Stable pelvis without tenderness to palpation.  Abdomen soft. 5/5 strength with bilateral plantar flexion and dorsiflexion.  She has intact sensation to sharp and dull bilateral lower extremities.  Her compartments are soft, I have low  suspicion for compartment syndrome.  She is neurovascularly intact.  She is able to wiggle toes without difficulty.  Unable to ambulate secondary to pain.  No bony tenderness.  Her plain film x-rays negative for fracture, dislocation or effusion.  Possible pain secondary to dislocation.  I have low suspicion for vascular or nerve injury, quadriceps rupture, DVT, patellar tendon injury, fracture, dislocation, Achilles tendon rupture.  Will place in straight knee splint and provide with crutches.  No evidence of infectious process.  She has no additional injuries on exam.  Pain well controlled in ED.  We will have her follow-up with orthopedics for reevaluation given pain to joint line.  The patient has been appropriately medically screened and/or stabilized in the ED. I have low suspicion for any other emergent medical condition which would require further screening, evaluation or treatment in the ED or require inpatient management.  Patient is hemodynamically stable and in no acute distress.  Patient able to ambulate in department prior to ED.  Evaluation does not show acute pathology that would require ongoing or additional emergent interventions while in the emergency department or further inpatient treatment.  I have discussed the diagnosis with the patient and answered all questions.  Pain is been managed while in the emergency department and patient has no further complaints prior to discharge.  Patient is comfortable with plan discussed in room and is stable for discharge at this time.  I have discussed strict return precautions for returning to the emergency department.  Patient was encouraged to follow-up with PCP/specialist refer to at discharge.   Patient has been discussed with my attending, Dr. Judd Lien who agrees with the treatment, plan and disposition.   Final Clinical Impressions(s) / ED Diagnoses   Final diagnoses:  Fall, initial encounter  Acute pain of left knee    ED Discharge Orders          Ordered    HYDROcodone-acetaminophen (NORCO/VICODIN) 5-325 MG tablet  Every 6 hours PRN     07/08/18 1747           Adaya Garmany A, PA-C 07/08/18 2331    Geoffery Lyons, MD 07/09/18 2249

## 2018-07-08 NOTE — Discharge Instructions (Signed)
Placed you in a knee immobilizer and crutches.  Follow-up with orthopedics in the next 24 hours.  Return to the ED for any new worsening symptoms

## 2018-07-08 NOTE — ED Notes (Signed)
Pt refused crutches. Pt has crutches at home

## 2018-07-08 NOTE — ED Triage Notes (Addendum)
Per EMS:  Pt was walking on vinyl floor, slipped and fell, heard a pop in her left knee.  Per pt she has frequent dislocations.  No other injuries.  Pt given 100 mcg fentanyl.

## 2019-02-18 ENCOUNTER — Emergency Department (HOSPITAL_COMMUNITY): Payer: Medicaid Other

## 2019-02-18 ENCOUNTER — Other Ambulatory Visit: Payer: Self-pay

## 2019-02-18 ENCOUNTER — Encounter (HOSPITAL_COMMUNITY): Payer: Self-pay

## 2019-02-18 ENCOUNTER — Emergency Department (HOSPITAL_COMMUNITY)
Admission: EM | Admit: 2019-02-18 | Discharge: 2019-02-19 | Disposition: A | Discharge status: Other Acute Inpatient Hospital | Payer: Medicaid Other | Attending: Emergency Medicine | Admitting: Emergency Medicine

## 2019-02-18 DIAGNOSIS — K2970 Gastritis, unspecified, without bleeding: Secondary | ICD-10-CM | POA: Diagnosis not present

## 2019-02-18 DIAGNOSIS — F332 Major depressive disorder, recurrent severe without psychotic features: Secondary | ICD-10-CM | POA: Diagnosis not present

## 2019-02-18 DIAGNOSIS — Z20822 Contact with and (suspected) exposure to covid-19: Secondary | ICD-10-CM | POA: Insufficient documentation

## 2019-02-18 DIAGNOSIS — F17210 Nicotine dependence, cigarettes, uncomplicated: Secondary | ICD-10-CM | POA: Diagnosis not present

## 2019-02-18 DIAGNOSIS — T391X2A Poisoning by 4-Aminophenol derivatives, intentional self-harm, initial encounter: Secondary | ICD-10-CM

## 2019-02-18 DIAGNOSIS — K2900 Acute gastritis without bleeding: Secondary | ICD-10-CM

## 2019-02-18 DIAGNOSIS — R45851 Suicidal ideations: Secondary | ICD-10-CM

## 2019-02-18 DIAGNOSIS — R1013 Epigastric pain: Secondary | ICD-10-CM | POA: Diagnosis present

## 2019-02-18 LAB — COMPREHENSIVE METABOLIC PANEL
ALT: 23 U/L (ref 0–44)
AST: 17 U/L (ref 15–41)
Albumin: 4.2 g/dL (ref 3.5–5.0)
Alkaline Phosphatase: 60 U/L (ref 38–126)
Anion gap: 9 (ref 5–15)
BUN: 11 mg/dL (ref 6–20)
CO2: 22 mmol/L (ref 22–32)
Calcium: 9.2 mg/dL (ref 8.9–10.3)
Chloride: 109 mmol/L (ref 98–111)
Creatinine, Ser: 0.78 mg/dL (ref 0.44–1.00)
GFR calc Af Amer: >60 mL/min (ref >60)
GFR calc non Af Amer: >60 mL/min (ref >60)
Glucose, Bld: 107 mg/dL — ABNORMAL HIGH (ref 70–99)
Potassium: 3.7 mmol/L (ref 3.5–5.1)
Sodium: 140 mmol/L (ref 135–145)
Total Bilirubin: 1.3 mg/dL — ABNORMAL HIGH (ref 0.3–1.2)
Total Protein: 7.6 g/dL (ref 6.5–8.1)

## 2019-02-18 LAB — I-STAT BETA HCG BLOOD, ED (MC, WL, AP ONLY): I-stat hCG, quantitative: <5 m[IU]/mL (ref <5)

## 2019-02-18 LAB — CBC
HCT: 45.1 % (ref 36.0–46.0)
Hemoglobin: 15.3 g/dL — ABNORMAL HIGH (ref 12.0–15.0)
MCH: 30.5 pg (ref 26.0–34.0)
MCHC: 33.9 g/dL (ref 30.0–36.0)
MCV: 89.8 fL (ref 80.0–100.0)
Platelets: 301 10*3/uL (ref 150–400)
RBC: 5.02 MIL/uL (ref 3.87–5.11)
RDW: 13.3 % (ref 11.5–15.5)
WBC: 9.4 10*3/uL (ref 4.0–10.5)
nRBC: 0 % (ref 0.0–0.2)

## 2019-02-18 LAB — ACETAMINOPHEN LEVEL: Acetaminophen (Tylenol), Serum: <10 ug/mL — ABNORMAL LOW (ref 10–30)

## 2019-02-18 LAB — LIPASE, BLOOD: Lipase: 17 U/L (ref 11–51)

## 2019-02-18 LAB — SALICYLATE LEVEL: Salicylate Lvl: <7 mg/dL — ABNORMAL LOW (ref 7.0–30.0)

## 2019-02-18 LAB — ETHANOL: Alcohol, Ethyl (B): <10 mg/dL (ref <10)

## 2019-02-18 MED ORDER — MORPHINE SULFATE (PF) 4 MG/ML IV SOLN
4.0000 mg | Freq: Once | INTRAVENOUS | Status: AC
  Administered 2019-02-18: 4 mg via INTRAVENOUS
  Filled 2019-02-18: qty 1

## 2019-02-18 MED ORDER — SODIUM CHLORIDE 0.9% FLUSH: 3.0000 mL | Freq: Once | INTRAVENOUS | Status: DC

## 2019-02-18 MED ORDER — ONDANSETRON HCL 4 MG/2ML IJ SOLN
4.0000 mg | Freq: Once | INTRAMUSCULAR | Status: AC
  Administered 2019-02-18: 4 mg via INTRAVENOUS
  Filled 2019-02-18: qty 2

## 2019-02-18 MED ORDER — SODIUM CHLORIDE 0.9 % IV BOLUS
1000.0000 mL | Freq: Once | INTRAVENOUS | Status: AC
  Administered 2019-02-18: 1000 mL via INTRAVENOUS

## 2019-02-18 NOTE — ED Provider Notes (Signed)
[facility]-EMERGENCY DEPT Provider Note   CSN: [id number] Arrival date & time: 02/18/19  2225     History Chief Complaint  Patient presents with   Suicidal   Abdominal Pain    Rachel Calhoun is a 24 y.o. female.  24 y/o female presents to the ED via EMS. EMS called by patient for c/o abdominal pain in her epigastrium which began at 1800. It has been constant and is nonradiating, described as a "tearing, burning" pain. She has had some anorexia and nausea, no vomiting. Does report 7 episodes of watery diarrhea today. No medications taken PTA. Received Fentanyl from EMS with some improvement in her pain. Abdominal SHx significant for cholecystectomy. Amenorrhea due to Nexplanon use.  As an aside, patient notes hx of anxiety and depression. She is not actively followed by a therapist or psychiatrist. No hx of behavioral health hospitalizations. Patient endorses worsening SI over the past few days with suicide attempt 3-4 days ago. Took 15 tablets of 500mg  Tylenol. No post ingestion vomiting. She further denies any other coingestion, ETOH use, illicit drug use. Had a panic attack tonight PTA.  The history is provided by the patient. No language interpreter was used.  Abdominal Pain      Past Medical History:  Diagnosis Date   Anxiety    Chronic back pain    DDD (degenerative disc disease), lumbar    Gallstones    Sciatica     Patient Active Problem List   Diagnosis Date Noted   Paronychia 12/18/2012    Past Surgical History:  Procedure Laterality Date   CHOLECYSTECTOMY     HERNIA REPAIR     I & D EXTREMITY Right 12/18/2012   Procedure: IRRIGATION AND DEBRIDEMENT RIGHT RING FINGER;  Surgeon: 12/20/2012, MD;  Location: [facility] OR;  Service: Orthopedics;  Laterality: Right;     OB History    Gravida  1   Para      Term      Preterm      AB      Living        SAB      TAB      Ectopic      Multiple      Live Births                History reviewed. No pertinent family history.  Social History   Tobacco Use   Smoking status: Current Some Day Smoker    Types: Cigarettes   Smokeless tobacco: Never Used  Substance Use Topics   Alcohol use: Yes    Comment: occ   Drug use: No    Home Medications Prior to Admission medications   Medication Sig Start Date End Date Taking? Authorizing Provider  etonogestrel (NEXPLANON) 68 MG IMPL implant 1 each by Subdermal route once. 06/18/17  Yes [provider]  baclofen (LIORESAL) 10 MG tablet Take 1 tablet (10 mg total) by mouth 3 (three) times daily. Patient not taking: Reported on 02/19/2019 05/29/16   05/31/16, PA-C  benzonatate (TESSALON) 100 MG capsule Take 1 capsule (100 mg total) by mouth every 8 (eight) hours. Patient not taking: Reported on 02/19/2019 03/29/18   03/31/18 A, PA-C  benzonatate (TESSALON) 100 MG capsule Take 1 capsule (100 mg total) by mouth every 8 (eight) hours. Patient not taking: Reported on 02/19/2019 03/29/18   03/31/18 A, PA-C  gabapentin (NEURONTIN) 300 MG capsule Take 1 capsule (300 mg total)  by mouth 2 (two) times daily. Patient not taking: Reported on 02/19/2019 12/02/16   Horton, Mayer Masker, MD    Allergies    Belladonna alkaloids and Clindamycin/lincomycin  Review of Systems   Review of Systems  Gastrointestinal: Positive for abdominal pain.  Ten systems reviewed and are negative for acute change, except as noted in the HPI.    Physical Exam Updated Vital Signs BP 131/61    Pulse 99    Temp 98.1 F (36.7 C) (Oral)    Resp (!) 28    SpO2 93%   Physical Exam Vitals and nursing note reviewed.  Constitutional:      General: She is not in acute distress.    Appearance: She is well-developed. She is not diaphoretic.     Comments: Obese female. Appears uncomfortable.  HENT:     Head: Normocephalic and atraumatic.  Eyes:     General: No scleral icterus.    Conjunctiva/sclera: Conjunctivae  normal.  Cardiovascular:     Rate and Rhythm: Normal rate and regular rhythm.     Pulses: Normal pulses.  Pulmonary:     Effort: Pulmonary effort is normal. No respiratory distress.     Breath sounds: No stridor. No wheezing.     Comments: Respirations even and unlabored Abdominal:     Palpations: Abdomen is soft.     Tenderness: There is abdominal tenderness (epigastric and minimal in LUQ).     Comments: Abdomen soft, nondistended. Obese without peritoneal signs. No palpable masses. Exam limited 2/2 habitus.  Musculoskeletal:        General: Normal range of motion.     Cervical back: Normal range of motion.  Skin:    General: Skin is warm and dry.     Coloration: Skin is not pale.     Findings: No erythema or rash.  Neurological:     General: No focal deficit present.     Mental Status: She is alert and oriented to person, place, and time.     Coordination: Coordination normal.  Psychiatric:        Mood and Affect: Mood is anxious and depressed.        Speech: Speech normal.        Behavior: Behavior normal.     Comments: Denies active SI at this time.     ED Results / Procedures / Treatments   Labs (all labs ordered are listed, but only abnormal results are displayed) Labs Reviewed  SALICYLATE LEVEL - Abnormal; Notable for the following components:      Result Value   Salicylate Lvl <7.0 (*)    All other components within normal limits  ACETAMINOPHEN LEVEL - Abnormal; Notable for the following components:   Acetaminophen (Tylenol), Serum <10 (*)    All other components within normal limits  CBC - Abnormal; Notable for the following components:   Hemoglobin 15.3 (*)    All other components within normal limits  COMPREHENSIVE METABOLIC PANEL - Abnormal; Notable for the following components:   Glucose, Bld 107 (*)    Total Bilirubin 1.3 (*)    All other components within normal limits  RESPIRATORY PANEL BY RT PCR (FLU A&B, COVID)  ETHANOL  LIPASE, BLOOD  RAPID URINE  DRUG SCREEN, HOSP PERFORMED  URINALYSIS, ROUTINE W REFLEX MICROSCOPIC  I-STAT BETA HCG BLOOD, ED ([facility], [facility], [facility] ONLY)  I-STAT BETA HCG BLOOD, ED ([facility], [facility], [facility] ONLY)    EKG None  Radiology CT ABDOMEN PELVIS W CONTRAST  Result Date:  02/19/2019 CLINICAL DATA:  Initial evaluation for acute abdominal pain. EXAM: CT ABDOMEN AND PELVIS WITH CONTRAST TECHNIQUE: Multidetector CT imaging of the abdomen and pelvis was performed using the standard protocol following bolus administration of intravenous contrast. CONTRAST:  OMNIPAQUE IOHEXOL 300 MG/ML  SOLN COMPARISON:  Prior CT from 04/15/2004. FINDINGS: Lower chest: Visualized lung bases are clear. Hepatobiliary: Liver demonstrates a normal contrast enhanced appearance. Gallbladder surgically absent. No biliary dilatation. Pancreas: Pancreas within normal limits. Spleen: Spleen within normal limits. Adrenals/Urinary Tract: Adrenal glands are normal. Kidneys equal in size with symmetric enhancement. No nephrolithiasis, hydronephrosis, or focal enhancing renal mass. No hydroureter. Partially distended bladder within normal limits. Stomach/Bowel: Stomach within normal limits. No evidence for bowel obstruction. Normal appendix. No acute inflammatory changes seen about the bowels. Vascular/Lymphatic: Normal intravascular enhancement seen throughout the intra-abdominal aorta mesenteric vessels patent proximally. No adenopathy. Reproductive: Uterus and ovaries within normal limits. Other: No free air or fluid. Small fat containing paraumbilical hernia noted without associated inflammation. Musculoskeletal: No acute osseous abnormality. Small benign bone island noted at the left acetabulum. No other discrete or worrisome osseous lesions. IMPRESSION: 1. No CT evidence for acute intra-abdominal or pelvic process. 2. Normal appendix. 3. Status post cholecystectomy. Electronically Signed   By: Rise Mu M.D.   On: 02/19/2019 02:33   US Abdomen Limited  Result  Date: 02/18/2019 CLINICAL DATA:  Upper abdominal pain EXAM: ULTRASOUND ABDOMEN LIMITED RIGHT UPPER QUADRANT COMPARISON:  02/06/2016 FINDINGS: Gallbladder: Surgically removed Common bile duct: Diameter: 2.1 mm Liver: Diffusely increased in echogenicity consistent with fatty infiltration. No focal mass is noted. Portal vein is patent on color Doppler imaging with normal direction of blood flow towards the liver. Other: None. IMPRESSION: Fatty liver. Status post cholecystectomy. Electronically Signed   By: Alcide Clever M.D.   On: 02/18/2019 23:30    Procedures Procedures (including critical care time)  Medications Ordered in ED Medications  sodium chloride flush (NS) 0.9 % injection 3 mL (has no administration in time range)  sodium chloride (PF) 0.9 % injection (has no administration in time range)  oxyCODONE (Oxy IR/ROXICODONE) immediate release tablet 5 mg (has no administration in time range)  pantoprazole (PROTONIX) EC tablet 20 mg (has no administration in time range)  morphine 4 MG/ML injection 4 mg (4 mg Intravenous Given 02/18/19 2340)  ondansetron (ZOFRAN) injection 4 mg (4 mg Intravenous Given 02/18/19 2339)  sodium chloride 0.9 % bolus 1,000 mL (0 mLs Intravenous Stopped 02/19/19 0139)  famotidine (PEPCID) IVPB 20 mg premix (20 mg Intravenous New Bag/Given 02/19/19 0055)  alum & mag hydroxide-simeth (MAALOX/MYLANTA) 200-200-20 MG/5ML suspension 30 mL (30 mLs Oral Given 02/19/19 0056)    And  lidocaine (XYLOCAINE) 2 % viscous mouth solution 15 mL (15 mLs Oral Given 02/19/19 0056)  iohexol (OMNIPAQUE) 300 MG/ML solution 100 mL (100 mLs Intravenous Contrast Given 02/19/19 0204)  LORazepam (ATIVAN) injection 1 mg (1 mg Intravenous Given 02/19/19 0257)    ED Course  I have reviewed the triage vital signs and the nursing notes.  Pertinent labs & imaging results that were available during my care of the patient were reviewed by me and considered in my medical decision making (see chart for  details).     MDM Rules/Calculators/A&P                      63:73 PM 24 year old female presents to the emergency department for evaluation of epigastric abdominal pain which began around 1800 tonight.  She has  had anorexia and nausea without vomiting.  Mostly tender in her epigastric abdomen.  Pending labs.  She does report history of prior cholecystectomy.  Also remote history of Tylenol overdose with suicidal intent.  She endorses taking 7.5 g of Tylenol 3 to 4 days ago.  Will ensure LFTs preserved.  Based on weight, no present concern for toxic ingestion.  Will require TTS evaluation when medically cleared.  12:08 AM Patient reassessed.  Updated on lab and imaging results.  She states that pain has presently subsided.  She reports that she is more comfortable.  We will add Pepcid as well as GI cocktail and continue to monitor pending medical clearance.  Anticipate psychiatric assessment once cleared.  1:11 AM Patient states that pain returned, but subsequently improved following Pepcid and GI cocktail.  I suspect that her symptoms are secondary to gastritis, possibly aggravated by untreated anxiety.  Given the need for medical clearance from psychiatric perspective, will obtain abdominal CT to ensure no other acute process.  Consult also placed to TTS for evaluation.  2:58 AM CT negative for acute process.  Patient medically cleared.  Will advise continuation of daily Protonix.  Patient has been accepted at [facility] for ongoing psychiatric care.  Plan for transfer.  VSS.   Final Clinical Impression(s) / ED Diagnoses Final diagnoses:  Acute gastritis, presence of bleeding unspecified, unspecified gastritis type  Suicidal ideations  Overdose on Tylenol, intentional self-harm, initial encounter [facility])    Rx / Edom Orders ED Discharge Orders    None       Antonietta Breach, PA-C 02/19/19 4034    Ripley Fraise, MD 02/19/19 [phone]

## 2019-02-18 NOTE — ED Notes (Addendum)
Pt. Made aware for the need of urine specimen. 

## 2019-02-18 NOTE — ED Triage Notes (Addendum)
Pt BIB [facility] from home. They were called out for an unconscious person. Upon arrival, they report that she was having panic attacks and extreme anxiety and "passing out." Pt also advises that she took 15 500mg  tylenol and cut herself 3-4 days ago. She reports worsening abdominal pain since and states that she has been unable to eat. Endorses SI. Given 50 mcg of fentanyl en route with EMS for agitation and pain.

## 2019-02-19 ENCOUNTER — Encounter (HOSPITAL_COMMUNITY): Payer: Self-pay | Admitting: Psychiatry

## 2019-02-19 ENCOUNTER — Encounter (HOSPITAL_COMMUNITY): Payer: Self-pay

## 2019-02-19 ENCOUNTER — Inpatient Hospital Stay (HOSPITAL_COMMUNITY)
Admission: AD | Admit: 2019-02-19 | Discharge: 2019-02-21 | DRG: 885 | Disposition: A | Discharge status: Home / Self Care | Payer: Medicaid Other | Source: Intra-hospital | Attending: Psychiatry | Admitting: Psychiatry

## 2019-02-19 ENCOUNTER — Emergency Department (HOSPITAL_COMMUNITY): Payer: Medicaid Other

## 2019-02-19 DIAGNOSIS — F332 Major depressive disorder, recurrent severe without psychotic features: Secondary | ICD-10-CM | POA: Diagnosis present

## 2019-02-19 DIAGNOSIS — Z915 Personal history of self-harm: Secondary | ICD-10-CM | POA: Diagnosis not present

## 2019-02-19 DIAGNOSIS — F411 Generalized anxiety disorder: Secondary | ICD-10-CM | POA: Diagnosis present

## 2019-02-19 DIAGNOSIS — R45851 Suicidal ideations: Secondary | ICD-10-CM | POA: Diagnosis present

## 2019-02-19 DIAGNOSIS — F410 Panic disorder [episodic paroxysmal anxiety] without agoraphobia: Secondary | ICD-10-CM | POA: Diagnosis present

## 2019-02-19 DIAGNOSIS — F17210 Nicotine dependence, cigarettes, uncomplicated: Secondary | ICD-10-CM | POA: Diagnosis present

## 2019-02-19 LAB — LIPID PANEL
Cholesterol: 203 mg/dL — ABNORMAL HIGH (ref 0–200)
HDL: 32 mg/dL — ABNORMAL LOW (ref >40)
LDL Cholesterol: 147 mg/dL — ABNORMAL HIGH (ref 0–99)
Total CHOL/HDL Ratio: 6.3 RATIO
Triglycerides: 122 mg/dL (ref <150)
VLDL: 24 mg/dL (ref 0–40)

## 2019-02-19 LAB — RESPIRATORY PANEL BY RT PCR (FLU A&B, COVID)
Influenza A by PCR: NEGATIVE
Influenza B by PCR: NEGATIVE
SARS Coronavirus 2 by RT PCR: NEGATIVE

## 2019-02-19 LAB — HEMOGLOBIN A1C
Hgb A1c MFr Bld: 5.1 % (ref 4.8–5.6)
Mean Plasma Glucose: 99.67 mg/dL

## 2019-02-19 LAB — TSH: TSH: 1.796 u[IU]/mL (ref 0.350–4.500)

## 2019-02-19 MED ORDER — PANTOPRAZOLE SODIUM 20 MG PO TBEC
20.0000 mg | DELAYED_RELEASE_TABLET | Freq: Every day | ORAL | Status: DC
  Filled 2019-02-19 (×4): qty 1

## 2019-02-19 MED ORDER — LORAZEPAM 2 MG/ML IJ SOLN
1.0000 mg | Freq: Once | INTRAMUSCULAR | Status: AC
  Administered 2019-02-19: 1 mg via INTRAVENOUS
  Filled 2019-02-19: qty 1

## 2019-02-19 MED ORDER — TRAZODONE HCL 50 MG PO TABS
50.0000 mg | ORAL_TABLET | Freq: Every evening | ORAL | Status: DC | PRN
  Administered 2019-02-19 – 2019-02-20 (×2): 50 mg via ORAL
  Filled 2019-02-19 (×3): qty 1

## 2019-02-19 MED ORDER — PANTOPRAZOLE SODIUM 20 MG PO TBEC: 20.0000 mg | DELAYED_RELEASE_TABLET | Freq: Every day | ORAL | Status: DC

## 2019-02-19 MED ORDER — OXYCODONE HCL 5 MG PO TABS: 5.0000 mg | ORAL_TABLET | Freq: Once | ORAL | Status: DC

## 2019-02-19 MED ORDER — MAGNESIUM HYDROXIDE 400 MG/5ML PO SUSP: 30.0000 mL | Freq: Every day | ORAL | Status: DC | PRN

## 2019-02-19 MED ORDER — ACETAMINOPHEN 325 MG PO TABS: 650.0000 mg | ORAL_TABLET | Freq: Four times a day (QID) | ORAL | Status: DC | PRN

## 2019-02-19 MED ORDER — IOHEXOL 300 MG/ML  SOLN
100.0000 mL | Freq: Once | INTRAMUSCULAR | Status: AC | PRN
  Administered 2019-02-19: 100 mL via INTRAVENOUS

## 2019-02-19 MED ORDER — HYDROXYZINE HCL 25 MG PO TABS
25.0000 mg | ORAL_TABLET | Freq: Three times a day (TID) | ORAL | Status: DC | PRN
  Administered 2019-02-21: 25 mg via ORAL
  Filled 2019-02-19 (×2): qty 1

## 2019-02-19 MED ORDER — ALUM & MAG HYDROXIDE-SIMETH 200-200-20 MG/5ML PO SUSP
30.0000 mL | Freq: Once | ORAL | Status: AC
  Administered 2019-02-19: 30 mL via ORAL
  Filled 2019-02-19: qty 30

## 2019-02-19 MED ORDER — FAMOTIDINE IN NACL 20-0.9 MG/50ML-% IV SOLN
20.0000 mg | Freq: Once | INTRAVENOUS | Status: AC
  Administered 2019-02-19: 20 mg via INTRAVENOUS
  Filled 2019-02-19: qty 50

## 2019-02-19 MED ORDER — LIDOCAINE VISCOUS HCL 2 % MT SOLN
15.0000 mL | Freq: Once | OROMUCOSAL | Status: AC
  Administered 2019-02-19: 15 mL via ORAL
  Filled 2019-02-19: qty 15

## 2019-02-19 MED ORDER — SODIUM CHLORIDE (PF) 0.9 % IJ SOLN
INTRAMUSCULAR | Status: AC
  Filled 2019-02-19: qty 50

## 2019-02-19 MED ORDER — FLUOXETINE HCL 20 MG PO CAPS
20.0000 mg | ORAL_CAPSULE | Freq: Every day | ORAL | Status: DC
  Administered 2019-02-19 – 2019-02-21 (×3): 20 mg via ORAL
  Filled 2019-02-19 (×5): qty 1

## 2019-02-19 MED ORDER — ALUM & MAG HYDROXIDE-SIMETH 200-200-20 MG/5ML PO SUSP: 30.0000 mL | ORAL | Status: DC | PRN
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
## 2019-02-19 NOTE — Tx Team (Signed)
Initial Treatment Plan 02/19/2019 6:13 AM Rachel Calhoun MMG:[id number]    PATIENT STRESSORS: Financial difficulties Loss of grandmother Marital or family conflict Medication change or noncompliance   PATIENT STRENGTHS: General fund of knowledge Motivation for treatment/growth   PATIENT IDENTIFIED PROBLEMS:  risk for suicide  Anxiety  Family issues  "anger management"               DISCHARGE CRITERIA:  Improved stabilization in mood, thinking, and/or behavior Verbal commitment to aftercare and medication compliance  PRELIMINARY DISCHARGE PLAN: Attend aftercare/continuing care group Attend PHP/IOP Outpatient therapy  PATIENT/FAMILY INVOLVEMENT: This treatment plan has been presented to and reviewed with the patient, Rachel Calhoun.  The patient and family have been given the opportunity to ask questions and make suggestions.  Delos Haring, RN 02/19/2019, 6:13 AM

## 2019-02-19 NOTE — Progress Notes (Signed)
D. Pt presents with a depressed affect/ mood- remained in bed all morning with complaints of being "very tired".  Pt currently denies SI/HI and AVH  A. Labs and vitals monitored. Pt compliant with medications. Pt supported emotionally and encouraged to express concerns and ask questions.   R. Pt remains safe with 15 minute checks. Will continue POC.

## 2019-02-19 NOTE — [facility] Group Notes (Signed)
[facility] Group Notes: (Clinical Social Work)   02/19/2019      Type of Therapy:  Group Therapy   Participation Level:  Did Not Attend - was invited both individually by MHT and by overhead announcement, chose not to attend.   Ambrose Mantle, LCSW 02/19/2019, 12:02 PM
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
## 2019-02-19 NOTE — [facility] Counselor (Signed)
Clinical Social Work Note  Psychosocial Assessment attempted 2 times, but patient was sleeping heavily.  Since she just arrived early on this date, CSW will follow up tomorrow.  Ambrose Mantle, LCSW 02/19/2019, 2:37 PM
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
## 2019-02-19 NOTE — [facility] Suicide Risk Assessment (Signed)
[facility] Admission Suicide Risk Assessment   Nursing information obtained from:  Patient Demographic factors:  Unemployed Current Mental Status:  NA Loss Factors:  Financial problems / change in socioeconomic status, Legal issues Historical Factors:  Prior suicide attempts, Impulsivity Risk Reduction Factors:  Living with another person, especially a relative  Total Time spent with patient: 15 minutes Principal Problem: Severe recurrent major depression without psychotic features (HCC) Diagnosis:  Principal Problem:   Severe recurrent major depression without psychotic features (HCC) Active Problems:   Generalized anxiety disorder with panic attacks  Subjective Data: Patient is a 24 year old female who presented to the [facility] emergency department on 02/19/2019 after experiencing a panic attack and then losing consciousness.  She reported a multiyear history of depressive symptoms including anxiety, panic attacks, but had never been evaluated formally by a psychiatrist.  She also disclosed that 3 to 4 days prior to admission she had ingested 15 tablets of 500 mg Tylenol.  She is also cut her wrist in a suicide attempt.  She continued to endorse suicidal ideation.  She admitted to helplessness, hopelessness and worthlessness.  She admitted to crying spells.  She stated that she had recently ended a relationship with a boyfriend.  She stated that she lives with her mother, stepfather and that that situation is not good as well.  She has not been able to find a job, and that is prevented her from being able to live independently.  Review of the electronic medical record revealed multiple emergency room visits for chest pain, back pain, chlamydia.  Urine drug screen is pending.  Blood alcohol was less than 10.  A CT scan was obtained secondary to abdominal pain.  It was essentially negative.  Continued Clinical Symptoms:  Alcohol Use Disorder Identification Test Final Score (AUDIT):  1 The "Alcohol Use Disorders Identification Test", Guidelines for Use in Primary Care, Second Edition.  World Science writer [facility]). Score between 0-7:  no or low risk or alcohol related problems. Score between 8-15:  moderate risk of alcohol related problems. Score between 16-19:  high risk of alcohol related problems. Score 20 or above:  warrants further diagnostic evaluation for alcohol dependence and treatment.   CLINICAL FACTORS:   Severe Anxiety and/or Agitation Panic Attacks Depression:   Anhedonia Hopelessness Impulsivity Insomnia   Musculoskeletal: Strength & Muscle Tone: within normal limits Gait & Station: normal Patient leans: N/A  Psychiatric Specialty Exam: Physical Exam  Nursing note and vitals reviewed. Constitutional: She is oriented to person, place, and time. She appears well-developed and well-nourished.  HENT:  Head: Normocephalic and atraumatic.  Respiratory: Effort normal.  Neurological: She is alert and oriented to person, place, and time.    Review of Systems  Blood pressure (!) 123/92, pulse 97, temperature 98.2 F (36.8 C), temperature source Oral, resp. rate 16, height 5\' 2"  (1.575 m), weight 104.3 kg, unknown if currently breastfeeding.Body mass index is 42.07 kg/m.  General Appearance: Disheveled  Eye Contact:  Fair  Speech:  Normal Rate  Volume:  Decreased  Mood:  Anxious and Depressed  Affect:  Congruent  Thought Process:  Coherent and Descriptions of Associations: Intact  Orientation:  Full (Time, Place, and Person)  Thought Content:  Logical  Suicidal Thoughts:  Yes.  without intent/plan  Homicidal Thoughts:  No  Memory:  Immediate;   Fair Recent;   Fair Remote;   Fair  Judgement:  Intact  Insight:  Fair  Psychomotor Activity:  Increased  Concentration:  Concentration: Fair  and Attention Span: Fair  Recall:  [employer] of Knowledge:  Fair  Language:  Fair  Akathisia:  Negative  Handed:  Right  AIMS (if indicated):      Assets:  Desire for Improvement Housing Resilience  ADL's:  Intact  Cognition:  WNL  Sleep:         COGNITIVE FEATURES THAT CONTRIBUTE TO RISK:  None    SUICIDE RISK:   Mild:  Suicidal ideation of limited frequency, intensity, duration, and specificity.  There are no identifiable plans, no associated intent, mild dysphoria and related symptoms, good self-control (both objective and subjective assessment), few other risk factors, and identifiable protective factors, including available and accessible social support.  PLAN OF CARE: Patient is seen and examined.  Patient is a 24 year old female with the above-stated past psychiatric history was admitted secondary to suicidal ideation.  She will be admitted to the hospital.  She will be integrated into the milieu.  She will be encouraged to attend groups.  She will be started on fluoxetine 20 mg p.o. daily.  We will monitor for response.  We are still waiting on a drug screen result.  She also be placed on Protonix for gastric protection.  She will also have available hydroxyzine for anxiety and trazodone for sleep if necessary.  I certify that inpatient services furnished can reasonably be expected to improve the patient's condition.   Sharma Covert, MD 02/19/2019, 12:46 PM

## 2019-02-20 NOTE — Progress Notes (Signed)
D.  Pt upset about service dog not being allowed on unit, was spoken to about this in group.  Pt was able to calm down, did remain in group.  Pt denies SI/HI/AVH at this time.  She states that vistaril does not work for her, did agree to take ordered Trazodone for sleep tonight.  A.  Support and encouragement offered  R.  Pt remains safe on the unit, will continue to monitor.

## 2019-02-20 NOTE — [facility] Suicide Risk Assessment (Signed)
[facility] INPATIENT:  Family/Significant Other Suicide Prevention Education  Suicide Prevention Education:  Education Completed; mother Rachel Calhoun [phone],  (name of family member/significant other) has been identified by the patient as the family member/significant other with whom the patient will be residing, and identified as the person(s) who will aid the patient in the event of a mental health crisis (suicidal ideations/suicide attempt).  With written consent from the patient, the family member/significant other has been provided the following suicide prevention education, prior to the and/or following the discharge of the patient.  Mother was initially tearful and upset that patient is in the hospital, saying she has never been in a place like this before.  She stated the patient has anger issues that she needs to learn to deal with in a proper way. Mother's perception is that part of her depression comes from losing her boyfriend, because this was the first time she has ever been in love.  Mother and stepfather have been working with her to create a better future, got her signed up for school and are waiting for financial aid.  But when the break-up happened, she seemed to lose interest.  She has always had anxiety attacks and normally mother can help to get her out of them, but with this latest panic attack, she was not with mother.  Instead, she was with a friend who did not know what to do, so he called EMS.    Mother has encouraged her for years to get help, and in high school arranged for her to see several different therapists.  The third therapist finally said "I can't help someone who doesn't want the help" and refused to take the family's money.  Mother thinks she still is resistant to treatment, but she will encourage her to follow up with mother's primary care physician [facility] for medication management and go to therapy.  The patient has told mother she does not want to be on  medicines.    Mother complained that patient will not go anywhere without her two dogs that she snuck into their house.  She lets her parents and her son go various places without her all the time because the dogs cannot go.  Most of the suggestions made by CSW have already been tried by mother.  She was given encouragement to call back to weekday social worker as needed.  The suicide prevention education provided includes the following:  Suicide risk factors  Suicide prevention and interventions  National Suicide Hotline telephone number  [facility] assessment telephone number  [facility] Emergency Assistance 911  [facility] and/or Residential Mobile Crisis Unit telephone number  Request made of family/significant other to:  Remove weapons (e.g., guns, rifles, knives), all items previously/currently identified as safety concern.    Remove drugs/medications (over-the-counter, prescriptions, illicit drugs), all items previously/currently identified as a safety concern.  The family member/significant other verbalizes understanding of the suicide prevention education information provided.  The family member/significant other agrees to remove the items of safety concern listed above.  Rachel Calhoun 02/20/2019, 4:24 PM

## 2019-02-20 NOTE — Progress Notes (Signed)
D. Pt remained in her room for much of the shift, but has been calm, cooperative, and friendly during interactions- per pt's self inventory, pt rated her depression, hopelessness and anxiety a 0/0/3, respectively. Pt writes that her goal today is "to continue writing in my journal ".. Pt currently denies SI/HI and AVH  A. Labs and vitals monitored. Pt compliant with medications. Pt supported emotionally and encouraged to express concerns and ask questions.   R. Pt remains safe with 15 minute checks. Will continue POC.
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
## 2019-02-20 NOTE — [facility] Group Notes (Signed)
[facility] LCSW Group Therapy Note  Date/Time:  02/20/2019 10:00-11:00AM  Type of Therapy and Topic:  Group Therapy:  Healthy and Unhealthy Supports plus being "My Own Hero"  Participation Level:  Active   Description of Group:  Patients in this group were invited to identify the differences between healthy and unhealthy supports and then to identify those people in their lives who fall into one category or the other, as well as why.  They were then introduced to the idea of adding more healthy supports and decrease the unhealthy ones.  Patients discussed what additional healthy supports could be helpful in their recovery and wellness after discharge in order to prevent future hospitalizations.   An emphasis was placed on using counselor, doctor, therapy groups, 12-step groups, and problem-specific support groups to expand supports.  They asked each other for ideas on how to handle several situations in their lives, and were receptive to the suggestions.  At the end of group, a song "My Own Hero" was played to encourage people to be full participants in their own recovery journey instead of expecting others to do the work for them.  Therapeutic Goals:   1)  discuss importance of adding supports to stay well once out of the hospital  2)  compare healthy versus unhealthy supports and identify some examples of each  3)  generate ideas and descriptions of healthy supports that can be added  4)  offer mutual support about how to address unhealthy supports  5)  encourage active participation in and adherence to discharge plan    Summary of Patient Progress:  The patient stated that current healthy supports in her life are her 5yo son only while current unhealthy supports include the people she hangs out with.  She stated that her mother and stepfather can be either healthy or unhealthy, depending on the day.  She stated her mother can be quite manipulative and treats her like she is still a 10yo child.   Even worse, her mother tries to parent her son.  The patient expressed a willingness to "consider" adding support(s) to help in her recovery journey.   Therapeutic Modalities:   Motivational Interviewing Brief Solution-Focused Therapy  Ambrose Mantle, LCSW

## 2019-02-20 NOTE — Progress Notes (Signed)
   02/20/19 2027  COVID-19 Daily Checkoff  Have you had a fever (temp > 37.80C/100F)  in the past 24 hours?  No  If you have had runny nose, nasal congestion, sneezing in the past 24 hours, has it worsened? No  COVID-19 EXPOSURE  Have you traveled outside the state in the past 14 days? No  Have you been in contact with someone with a confirmed diagnosis of COVID-19 or PUI in the past 14 days without wearing appropriate PPE? No  Have you been living in the same home as a person with confirmed diagnosis of COVID-19 or a PUI (household contact)? No  Have you been diagnosed with COVID-19? No

## 2019-02-20 NOTE — Progress Notes (Signed)
[facility] Group Notes:  (Nursing/MHT/Case Management/Adjunct)  Date:  02/20/2019  Time:  2030  Type of Therapy:  wrap up group  Participation Level:  Active  Participation Quality:  Appropriate, Attentive, Sharing and Supportive  Affect:  Appropriate  Cognitive:  Appropriate  Insight:  Improving  Engagement in Group:  Engaged  Modes of Intervention:  Clarification, Education and Support  Summary of Progress/Problems: Positive thinking and positive change were discussed.   Marcille Buffy 02/20/2019, 9:15 PM

## 2019-02-20 NOTE — Progress Notes (Signed)
[facility] MD Progress Note  02/20/2019 11:55 AM Rachel Calhoun  MRN:  [id number] Subjective: "I am feeling better today since I got to sleep." Principal Problem: Severe recurrent major depression without psychotic features ([place]) Diagnosis: Principal Problem:   Severe recurrent major depression without psychotic features [facility]) Active Problems:   Generalized anxiety disorder with panic attacks  February 20, 2019: Chart reviewed; patient seen for face to face evaluation and discussed with Dr. Mallie Darting.  Rachel Calhoun is in the dayroom, sitting and working on a project independently.  She agrees to talk with this Probation officer in her room for privacy.  She actively participates in the interview today and offers detailed responses to questions.  This is definitely improved from yesterday when she required frequent verbal promptings to participate.  She is visibly less irritated as well.  She relates she slept "good" yesterday which is why her symptoms have improved.  She denies depression today, rates it 0/10 (on a scale where 0/10 is no depression and 10/10 is severe depression). She does endorse anxiety, rates it 3/10, using the same scale that is related to "my mind wandering, but it will settle down." She does not name anything specifically but has hydroxyzine prn ordered which she states helps.    She has two service dogs at home, one in particular she would like to bring to the unit with her. She was informed yesterday that this will not happen but she continues to question why.  We reviewed the safety concerns with bringing her private pet to the unit, extra time spent to ensure patient understanding.    She started fluoxetine 20mg  yesterday, denies additional GI concerns or medication side effects.  She denies suicidal or homicidal ideations; no paranoia or delusional behaviors observed.  She was calm and interactive throughout the interview.        Total Time spent with patient: 15 minutes  Past Psychiatric  History:   Past Medical History:  Past Medical History:  Diagnosis Date  . Anxiety   . Chronic back pain   . DDD (degenerative disc disease), lumbar   . Gallstones   . Sciatica     Past Surgical History:  Procedure Laterality Date  . CHOLECYSTECTOMY    . HERNIA REPAIR    . I & D EXTREMITY Right 12/18/2012   Procedure: IRRIGATION AND DEBRIDEMENT RIGHT RING FINGER;  Surgeon: Tennis Must, MD;  Location: [place];  Service: Orthopedics;  Laterality: Right;   Family History: History reviewed. No pertinent family history. Family Psychiatric  History: unknown Social History:  Social History   Substance and Sexual Activity  Alcohol Use Yes   Comment: occ     Social History   Substance and Sexual Activity  Drug Use No    Social History   Socioeconomic History  . Marital status: Single    Spouse name: Not on file  . Number of children: Not on file  . Years of education: Not on file  . Highest education level: Not on file  Occupational History  . Not on file  Tobacco Use  . Smoking status: Current Some Day Smoker    Packs/day: 0.25    Types: Cigarettes  . Smokeless tobacco: Never Used  Substance and Sexual Activity  . Alcohol use: Yes    Comment: occ  . Drug use: No  . Sexual activity: Yes    Birth control/protection: Implant  Other Topics Concern  . Not on file  Social History Narrative  . Not  on file   Social Determinants of Health   Financial Resource Strain:   . Difficulty of Paying Living Expenses: Not on file  Food Insecurity:   . Worried About Programme researcher, broadcasting/film/video in the Last Year: Not on file  . Ran Out of Food in the Last Year: Not on file  Transportation Needs:   . Lack of Transportation (Medical): Not on file  . Lack of Transportation (Non-Medical): Not on file  Physical Activity:   . Days of Exercise per Week: Not on file  . Minutes of Exercise per Session: Not on file  Stress:   . Feeling of Stress : Not on file  Social Connections:   .  Frequency of Communication with Friends and Family: Not on file  . Frequency of Social Gatherings with Friends and Family: Not on file  . Attends Religious Services: Not on file  . Active Member of Clubs or Organizations: Not on file  . Attends Banker Meetings: Not on file  . Marital Status: Not on file   Additional Social History:                         Sleep: Good  Appetite:  states she ate half of her breakfast  Current Medications: Current Facility-Administered Medications  Medication Dose Route Frequency Provider Last Rate Last Admin  . acetaminophen (TYLENOL) tablet 650 mg  650 mg Oral Q6H PRN Nira Conn A, NP      . alum & mag hydroxide-simeth (MAALOX/MYLANTA) 200-200-20 MG/5ML suspension 30 mL  30 mL Oral Q4H PRN Nira Conn A, NP      . FLUoxetine (PROZAC) capsule 20 mg  20 mg Oral Daily Ophelia Shoulder E, NP   20 mg at 02/20/19 0952  . hydrOXYzine (ATARAX/VISTARIL) tablet 25 mg  25 mg Oral TID PRN Nira Conn A, NP      . magnesium hydroxide (MILK OF MAGNESIA) suspension 30 mL  30 mL Oral Daily PRN Nira Conn A, NP      . pantoprazole (PROTONIX) EC tablet 20 mg  20 mg Oral Daily Nira Conn A, NP      . traZODone (DESYREL) tablet 50 mg  50 mg Oral QHS PRN Antonieta Pert, MD   50 mg at 02/19/19 2352    Lab Results:  Results for orders placed or performed during the hospital encounter of 02/19/19 (from the past 48 hour(s))  Hemoglobin A1c     Status: None   Collection Time: 02/19/19  6:55 AM  Result Value Ref Range   Hgb A1c MFr Bld 5.1 4.8 - 5.6 %    Comment: (NOTE) Pre diabetes:          5.7%-6.4% Diabetes:              >6.4% Glycemic control for   <7.0% adults with diabetes    Mean Plasma Glucose 99.67 mg/dL    Comment: Performed at [facility], [address], [place], [place] [address]  Lipid panel     Status: Abnormal   Collection Time: 02/19/19  6:55 AM  Result Value Ref Range   Cholesterol 203 (H) 0 - 200 mg/dL    Triglycerides 476 <546 mg/dL   HDL 32 (L) >50 mg/dL   Total CHOL/HDL Ratio 6.3 RATIO   VLDL 24 0 - 40 mg/dL   LDL Cholesterol 354 (H) 0 - 99 mg/dL    Comment:  Total Cholesterol/HDL:CHD Risk Coronary Heart Disease Risk Table                     Men   Women  1/2 Average Risk   3.4   3.3  Average Risk       5.0   4.4  2 X Average Risk   9.6   7.1  3 X Average Risk  23.4   11.0        Use the calculated Patient Ratio above and the CHD Risk Table to determine the patient's CHD Risk.        ATP III CLASSIFICATION (LDL):  <100     mg/dL   Optimal  662-947  mg/dL   Near or Above                    Optimal  130-159  mg/dL   Borderline  654-650  mg/dL   High  >354     mg/dL   Very High Performed at [facility], [address], [place], [place] [address]   TSH     Status: None   Collection Time: 02/19/19  6:55 AM  Result Value Ref Range   TSH 1.796 0.350 - 4.500 uIU/mL    Comment: Performed by a 3rd Generation assay with a functional sensitivity of <=0.01 uIU/mL. Performed at [facility], [address] [place], [place] [address]     Blood Alcohol level:  Lab Results  Component Value Date   ETH <10 02/18/2019    Metabolic Disorder Labs: Lab Results  Component Value Date   HGBA1C 5.1 02/19/2019   MPG 99.67 02/19/2019   No results found for: PROLACTIN Lab Results  Component Value Date   CHOL 203 (H) 02/19/2019   TRIG 122 02/19/2019   HDL 32 (L) 02/19/2019   CHOLHDL 6.3 02/19/2019   VLDL 24 02/19/2019   LDLCALC 147 (H) 02/19/2019    Physical Findings: AIMS: Facial and Oral Movements Muscles of Facial Expression: None, normal Lips and Perioral Area: None, normal Jaw: None, normal Tongue: None, normal,Extremity Movements Upper (arms, wrists, hands, fingers): None, normal Lower (legs, knees, ankles, toes): None, normal, Trunk Movements Neck, shoulders, hips: None, normal, Overall Severity Severity of abnormal movements  (highest score from questions above): None, normal Incapacitation due to abnormal movements: None, normal Patient's awareness of abnormal movements (rate only patient's report): No Awareness, Dental Status Current problems with teeth and/or dentures?: No Does patient usually wear dentures?: No  CIWA:    COWS:     Musculoskeletal: Strength & Muscle Tone: within normal limits Gait & Station: normal Patient leans: Right  Psychiatric Specialty Exam: Physical Exam  Constitutional: She is oriented to person, place, and time. She appears well-developed.  HENT:  Head: Normocephalic.  Eyes: Pupils are equal, round, and reactive to light.  Cardiovascular: Normal rate.  Respiratory: Effort normal.  Musculoskeletal:        General: Normal range of motion.     Cervical back: Normal range of motion.  Neurological: She is alert and oriented to person, place, and time.  Psychiatric: Her behavior is normal. Judgment and thought content normal.    Review of Systems  Gastrointestinal: Negative for diarrhea, nausea and vomiting.  Psychiatric/Behavioral: Negative for agitation, decreased concentration, dysphoric mood, hallucinations, sleep disturbance (present on admission but has resolved) and suicidal ideas (present on admission, now resolved). The patient is nervous/anxious.     Blood pressure 116/80, pulse (!) 103,  temperature 98.1 F (36.7 C), resp. rate 16, height 5\' 2"  (1.575 m), weight 104.3 kg, SpO2 99 %, unknown if currently breastfeeding.Body mass index is 42.07 kg/m.  General Appearance: Casual and groomed  Eye Contact:  Good  Speech:  Clear and Coherent and Normal Rate  Volume:  Normal  Mood:  Anxious and Euthymic  Affect:  Congruent and patient smiles and laughs during interview  Thought Process:  Coherent and Descriptions of Associations: Intact  Orientation:  Full (Time, Place, and Person)  Thought Content:  Logical, Rumination and ruminates on her service dog, she would like to  bring inpatient with her  Suicidal Thoughts:  No  Homicidal Thoughts:  No  Memory:  Immediate;   Good Recent;   Good Remote;   Good  Judgement:  Intact  Insight:  Fair  Psychomotor Activity:  Normal  Concentration:  Concentration: Good and Attention Span: Good  Recall:  Good  Fund of Knowledge:  Good  Language:  Good  Akathisia:  Negative  Handed:  Right  AIMS (if indicated):     Assets:  Communication Skills Desire for Improvement Housing Social Support  ADL's:  Intact  Cognition:  WNL  Sleep:  >8 hours     Treatment Plan Summary: Daily contact with patient to assess and evaluate symptoms and progress in treatment and Medication management; Continue to participate in the milieu and group attendance.   Physician Treatment Plan for Primary Diagnosis: Severe recurrent major depression without psychotic features ([facility]) Long Term Goal(s): Improvement in symptoms so as ready for discharge  Short Term Goals: Ability to identify changes in lifestyle to reduce recurrence of condition will improve, Ability to verbalize feelings will improve, Ability to disclose and discuss suicidal ideas, Ability to demonstrate self-control will improve, Ability to identify and develop effective coping behaviors will improve and Ability to identify triggers associated with substance abuse/mental health issues will improve  Physician Treatment Plan for Secondary Diagnosis: Principal Problem:   Severe recurrent major depression without psychotic features ([facility]) Active Problems:   Generalized anxiety disorder with panic attacks  Long Term Goal(s): Improvement in symptoms so as ready for discharge  Short Term Goals: Ability to identify changes in lifestyle to reduce recurrence of condition will improve, Ability to verbalize feelings will improve, Ability to disclose and discuss suicidal ideas, Ability to demonstrate self-control will improve, Ability to identify and develop effective coping behaviors  will improve and Ability to identify triggers associated with substance abuse/mental health issues will improve   Medications:  Continue fluoxetine 20mg  daily for depression Continue hydroxyzine 25mg  TID prn anxiety Continue trazodone 50mg  qhs prn insomnia  , NP 02/20/2019, 11:55 AM

## 2019-02-20 NOTE — Progress Notes (Signed)
   02/20/19 2033  Psych Admission Type (Psych Patients Only)  Admission Status Voluntary  Psychosocial Assessment  Patient Complaints Anxiety  Eye Contact Fair  Facial Expression Anxious  Affect Appropriate to circumstance  Speech Logical/coherent  Interaction Assertive;Attention-seeking  Motor Activity Other (Comment) (WDL)  Appearance/Hygiene Unremarkable  Behavior Characteristics Appropriate to situation  Mood Anxious  Thought Process  Coherency WDL  Content WDL  Delusions None reported or observed  Perception WDL  Hallucination None reported or observed  Judgment Impaired  Confusion None  Danger to Self  Current suicidal ideation? Denies  Danger to Others  Danger to Others None reported or observed

## 2019-02-21 MED ORDER — PANTOPRAZOLE SODIUM 20 MG PO TBEC: 20.0000 mg | DELAYED_RELEASE_TABLET | Freq: Every day | ORAL | 0 refills | Status: DC

## 2019-02-21 MED ORDER — TRAZODONE HCL 50 MG PO TABS: 50.0000 mg | ORAL_TABLET | Freq: Every evening | ORAL | 0 refills | Status: DC | PRN

## 2019-02-21 MED ORDER — FLUOXETINE HCL 20 MG PO CAPS: 20.0000 mg | ORAL_CAPSULE | Freq: Every day | ORAL | 0 refills | Status: AC

## 2019-02-21 NOTE — BHH Suicide Risk Assessment (Signed)
[facility] Discharge Suicide Risk Assessment   Principal Problem: Severe recurrent major depression without psychotic features [facility]) Discharge Diagnoses: Principal Problem:   Severe recurrent major depression without psychotic features ([facility]) Active Problems:   Generalized anxiety disorder with panic attacks   Total Time spent with patient: 20 minutes  Musculoskeletal: Strength & Muscle Tone: within normal limits Gait & Station: normal Patient leans: N/A  Psychiatric Specialty Exam: Review of Systems  Musculoskeletal: Positive for arthralgias and back pain.  All other systems reviewed and are negative.   Blood pressure 116/80, pulse (!) 103, temperature 98.1 F (36.7 C), resp. rate 16, height 5\' 2"  (1.575 m), weight 104.3 kg, SpO2 99 %, unknown if currently breastfeeding.Body mass index is 42.07 kg/m.  General Appearance: Casual  Eye Contact::  Good  Speech:  Normal Rate409  Volume:  Normal  Mood:  Euthymic  Affect:  Congruent  Thought Process:  Coherent and Descriptions of Associations: Intact  Orientation:  Full (Time, Place, and Person)  Thought Content:  Logical  Suicidal Thoughts:  No  Homicidal Thoughts:  No  Memory:  Immediate;   Good Recent;   Good Remote;   Good  Judgement:  Intact  Insight:  Fair  Psychomotor Activity:  Normal  Concentration:  Good  Recall:  Good  Fund of Knowledge:Good  Language: Good  Akathisia:  Negative  Handed:  Right  AIMS (if indicated):     Assets:  Communication Skills Desire for Improvement Housing Resilience Social Support Talents/Skills  Sleep:  Number of Hours: 3.25  Cognition: WNL  ADL's:  Intact   Mental Status Per Nursing Assessment::   On Admission:  NA  Demographic Factors:  Caucasian, Low socioeconomic status and Unemployed  Loss Factors: Loss of significant relationship and Financial problems/change in socioeconomic status  Historical Factors: Impulsivity  Risk Reduction Factors:   Responsible for children  under 64 years of age and Living with another person, especially a relative  Continued Clinical Symptoms:  Severe Anxiety and/or Agitation Depression:   Impulsivity  Cognitive Features That Contribute To Risk:  None    Suicide Risk:  Minimal: No identifiable suicidal ideation.  Patients presenting with no risk factors but with morbid ruminations; may be classified as minimal risk based on the severity of the depressive symptoms    Plan Of Care/Follow-up recommendations:  Activity:  ad lib  15, MD 02/21/2019, 8:45 AM

## 2019-02-21 NOTE — Progress Notes (Signed)
  [facility] Adult Case Management Discharge Plan :  Will you be returning to the same living situation after discharge:  Yes,  patient is returning home with her mother At discharge, do you have transportation home?: Yes,  patient's mother is picking her up  Do you have the ability to pay for your medications: Yes,  Medicaid  Release of information consent forms completed and in the chart;  Patient's signature needed at discharge.  Patient to Follow up at: Follow-up Information    [facility]. Go on 02/24/2019.   Why: Your initial appointment is Thursday, 02/24/2019 at 8:30am with Nyra Jabs to establish medication management and therapy services. Be sure to bring your Photo ID, Medicaid card, and a mask! For any additional questions, please call number listed.   Contact information: [address] [place] [address] [phone]           Next level of care provider has access to [facility] Link:yes  Safety Planning and Suicide Prevention discussed: Yes,  with the patient's mother  Have you used any form of tobacco in the last 30 days? (Cigarettes, Smokeless Tobacco, Cigars, and/or Pipes): Yes  Has patient been referred to the Quitline?: Patient refused referral  Patient has been referred for addiction treatment: N/A  Maeola Sarah, LCSWA 02/21/2019, 10:46 AM

## 2019-02-21 NOTE — Progress Notes (Signed)
D:  Patient's self inventory sheet, patient has fair sleep, sleep medication helpful.  Fair appetite, normal energy level, good concentration.  Denied depression and hopeless, rated anxiety #3.  Denied withdrawals.  Denied SI.  Denied physical problems.  Denied physical pain.  Goal is discharge and plans to discharge.  "Thank you all."  Does have discharge plans. A:  Medications administered per MD orders.  Emotional support and encouragement given patient. R:  Denied SI and HI, contracts for safety.  Denied A/V hallucinations.  Safety maintained with 15 minute checks.

## 2019-02-21 NOTE — Progress Notes (Signed)
Recreation Therapy Notes  Date: 1.18.21 Time: 0930 Location: 300 [address] Dayroom  Group Topic: Stress Management  Goal Area(s) Addresses:  Patient will identify positive stress management techniques. Patient will identify benefits of using stress management post d/c.  Behavioral Response: Engaged  Intervention: Stress Management  Activity: Meditation.  LRT introduced the stress management technique of meditation.  The meditation focused on taking on the characteristics of the mountain (tall and strong).  Patients were to listen and follow along as the meditation played in order to engage in activity.   Education:  Stress Management, Discharge Planning.   Education Outcome: Acknowledges Education  Clinical Observations/Feedback: Pt attended and participated in activity.    Rachel Calhoun, LRT/CTRS         Rachel Calhoun, Rachel Calhoun 02/21/2019 11:12 AM

## 2019-02-21 NOTE — Progress Notes (Signed)
Discharge Note: Patient discharged to home with family member per MD order. Patient denied SI, HI, A/V hallucinations at discharge. She voiced appreciation for [facility] staff assistance. All personal belongings returned. Discharge paperwork completed and reviewed with patient. Patient verbalized understanding and denied all questions. All discharge documentation completed.

## 2019-02-21 NOTE — Tx Team (Signed)
Interdisciplinary Treatment and Diagnostic Plan Update  02/21/2019 Time of Session: 9:00am Rachel Calhoun MRN: [id number]  Principal Diagnosis: Severe recurrent major depression without psychotic features [facility])  Secondary Diagnoses: Principal Problem:   Severe recurrent major depression without psychotic features ([place]) Active Problems:   Generalized anxiety disorder with panic attacks   Current Medications:  Current Facility-Administered Medications  Medication Dose Route Frequency Provider Last Rate Last Admin  . acetaminophen (TYLENOL) tablet 650 mg  650 mg Oral Q6H PRN Lindon Romp A, NP      . alum & mag hydroxide-simeth (MAALOX/MYLANTA) 200-200-20 MG/5ML suspension 30 mL  30 mL Oral Q4H PRN Lindon Romp A, NP      . FLUoxetine (PROZAC) capsule 20 mg  20 mg Oral Daily Merlyn Lot E, NP   20 mg at 02/21/19 0845  . hydrOXYzine (ATARAX/VISTARIL) tablet 25 mg  25 mg Oral TID PRN Rozetta Nunnery, NP   25 mg at 02/21/19 0849  . magnesium hydroxide (MILK OF MAGNESIA) suspension 30 mL  30 mL Oral Daily PRN Lindon Romp A, NP      . pantoprazole (PROTONIX) EC tablet 20 mg  20 mg Oral Daily Lindon Romp A, NP      . traZODone (DESYREL) tablet 50 mg  50 mg Oral QHS PRN Sharma Covert, MD   50 mg at 02/20/19 2306   PTA Medications: Medications Prior to Admission  Medication Sig Dispense Refill Last Dose  . baclofen (LIORESAL) 10 MG tablet Take 1 tablet (10 mg total) by mouth 3 (three) times daily. (Patient not taking: Reported on 02/19/2019) 30 each 0   . benzonatate (TESSALON) 100 MG capsule Take 1 capsule (100 mg total) by mouth every 8 (eight) hours. (Patient not taking: Reported on 02/19/2019) 21 capsule 0   . benzonatate (TESSALON) 100 MG capsule Take 1 capsule (100 mg total) by mouth every 8 (eight) hours. (Patient not taking: Reported on 02/19/2019) 21 capsule 0   . etonogestrel (NEXPLANON) 68 MG IMPL implant 1 each by Subdermal route once.     . gabapentin (NEURONTIN) 300 MG capsule  Take 1 capsule (300 mg total) by mouth 2 (two) times daily. (Patient not taking: Reported on 02/19/2019) 60 capsule 0     Patient Stressors: Financial difficulties Loss of grandmother Marital or family conflict Medication change or noncompliance  Patient Strengths: Technical sales engineer for treatment/growth  Treatment Modalities: Medication Management, Group therapy, Case management,  1 to 1 session with clinician, Psychoeducation, Recreational therapy.   Physician Treatment Plan for Primary Diagnosis: Severe recurrent major depression without psychotic features ([place]) Long Term Goal(s): Improvement in symptoms so as ready for discharge Improvement in symptoms so as ready for discharge   Short Term Goals: Ability to identify changes in lifestyle to reduce recurrence of condition will improve Ability to verbalize feelings will improve Ability to disclose and discuss suicidal ideas Ability to demonstrate self-control will improve Ability to identify and develop effective coping behaviors will improve Ability to identify triggers associated with substance abuse/mental health issues will improve Ability to identify changes in lifestyle to reduce recurrence of condition will improve Ability to verbalize feelings will improve Ability to disclose and discuss suicidal ideas Ability to demonstrate self-control will improve Ability to identify and develop effective coping behaviors will improve Ability to identify triggers associated with substance abuse/mental health issues will improve  Medication Management: Evaluate patient's response, side effects, and tolerance of medication regimen.  Therapeutic Interventions: 1 to 1 sessions, Unit Group  sessions and Medication administration.  Evaluation of Outcomes: Adequate for Discharge  Physician Treatment Plan for Secondary Diagnosis: Principal Problem:   Severe recurrent major depression without psychotic features (H[facility]) Active  Problems:   Generalized anxiety disorder with panic attacks  Long Term Goal(s): Improvement in symptoms so as ready for discharge Improvement in symptoms so as ready for discharge   Short Term Goals: Ability to identify changes in lifestyle to reduce recurrence of condition will improve Ability to verbalize feelings will improve Ability to disclose and discuss suicidal ideas Ability to demonstrate self-control will improve Ability to identify and develop effective coping behaviors will improve Ability to identify triggers associated with substance abuse/mental health issues will improve Ability to identify changes in lifestyle to reduce recurrence of condition will improve Ability to verbalize feelings will improve Ability to disclose and discuss suicidal ideas Ability to demonstrate self-control will improve Ability to identify and develop effective coping behaviors will improve Ability to identify triggers associated with substance abuse/mental health issues will improve     Medication Management: Evaluate patient's response, side effects, and tolerance of medication regimen.  Therapeutic Interventions: 1 to 1 sessions, Unit Group sessions and Medication administration.  Evaluation of Outcomes: Adequate for Discharge   RN Treatment Plan for Primary Diagnosis: Severe recurrent major depression without psychotic features (HCC) Long Term Goal(s): Knowledge of disease and therapeutic regimen to maintain health will improve  Short Term Goals: Ability to verbalize feelings will improve  Medication Management: RN will administer medications as ordered by provider, will assess and evaluate patient's response and provide education to patient for prescribed medication. RN will report any adverse and/or side effects to prescribing provider.  Therapeutic Interventions: 1 on 1 counseling sessions, Psychoeducation, Medication administration, Evaluate responses to treatment, Monitor vital signs  and CBGs as ordered, Perform/monitor CIWA, COWS, AIMS and Fall Risk screenings as ordered, Perform wound care treatments as ordered.  Evaluation of Outcomes: Adequate for Discharge   LCSW Treatment Plan for Primary Diagnosis: Severe recurrent major depression without psychotic features (HCC) Long Term Goal(s): Safe transition to appropriate next level of care at discharge, Engage patient in therapeutic group addressing interpersonal concerns.  Short Term Goals: Engage patient in aftercare planning with referrals and resources and Increase skills for wellness and recovery  Therapeutic Interventions: Assess for all discharge needs, 1 to 1 time with Social worker, Explore available resources and support systems, Assess for adequacy in community support network, Educate family and significant other(s) on suicide prevention, Complete Psychosocial Assessment, Interpersonal group therapy.  Evaluation of Outcomes: Adequate for Discharge  Progress in Treatment: Attending groups: Yes. Participating in groups: Yes. Taking medication as prescribed: Yes. Toleration medication: Yes. Family/Significant other contact made: Yes, individual(s) contacted:  mother. Patient understands diagnosis: Yes. Discussing patient identified problems/goals with staff: Yes. Medical problems stabilized or resolved: Yes. Denies suicidal/homicidal ideation: Yes. Issues/concerns per patient self-inventory: No.  New problem(s) identified: No, Describe:  none.  New Short Term/Long Term Goal(s): medication management for mood stabilization; elimination of SI thoughts; development of comprehensive mental wellness/sobriety plan.  Patient Goals:  "Go home."  Discharge Plan or Barriers: Returning home, following up with [facility] for outpatient.   Reason for Continuation of Hospitalization: Depression  Estimated Length of Stay: discharging today  Attendees: Patient: Rachel Calhoun 02/21/2019 9:47 AM  Physician: Marguerita Merles  02/21/2019 9:47 AM  Nursing: Meriam Sprague, RN 02/21/2019 9:47 AM  RN Care Manager: 02/21/2019 9:47 AM  Social Worker: Enid Cutter, LCSWA 02/21/2019 9:47 AM  Recreational Therapist:  02/21/2019  9:47 AM  Other: Marciano Sequin, NP 02/21/2019 9:47 AM  Other:  02/21/2019 9:47 AM  Other: 02/21/2019 9:47 AM    Scribe for Treatment Team: Darreld Mclean, LCSWA 02/21/2019 9:47 AM
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
## 2019-07-31 ENCOUNTER — Encounter (HOSPITAL_BASED_OUTPATIENT_CLINIC_OR_DEPARTMENT_OTHER): Payer: Self-pay | Admitting: Emergency Medicine

## 2019-07-31 ENCOUNTER — Other Ambulatory Visit: Payer: Self-pay

## 2019-07-31 ENCOUNTER — Emergency Department (HOSPITAL_BASED_OUTPATIENT_CLINIC_OR_DEPARTMENT_OTHER)
Admission: EM | Admit: 2019-07-31 | Discharge: 2019-07-31 | Disposition: A | Discharge status: Home / Self Care | Payer: Medicaid Other | Attending: Emergency Medicine | Admitting: Emergency Medicine

## 2019-07-31 DIAGNOSIS — S29012A Strain of muscle and tendon of back wall of thorax, initial encounter: Secondary | ICD-10-CM | POA: Diagnosis not present

## 2019-07-31 DIAGNOSIS — X58XXXA Exposure to other specified factors, initial encounter: Secondary | ICD-10-CM | POA: Insufficient documentation

## 2019-07-31 DIAGNOSIS — Y92019 Unspecified place in single-family (private) house as the place of occurrence of the external cause: Secondary | ICD-10-CM | POA: Diagnosis not present

## 2019-07-31 DIAGNOSIS — F17210 Nicotine dependence, cigarettes, uncomplicated: Secondary | ICD-10-CM | POA: Insufficient documentation

## 2019-07-31 DIAGNOSIS — Y9389 Activity, other specified: Secondary | ICD-10-CM | POA: Diagnosis not present

## 2019-07-31 DIAGNOSIS — Y999 Unspecified external cause status: Secondary | ICD-10-CM | POA: Diagnosis not present

## 2019-07-31 DIAGNOSIS — S3992XA Unspecified injury of lower back, initial encounter: Secondary | ICD-10-CM | POA: Diagnosis present

## 2019-07-31 DIAGNOSIS — S39012A Strain of muscle, fascia and tendon of lower back, initial encounter: Secondary | ICD-10-CM

## 2019-07-31 MED ORDER — KETOROLAC TROMETHAMINE 60 MG/2ML IM SOLN
30.0000 mg | Freq: Once | INTRAMUSCULAR | Status: AC
  Administered 2019-07-31: 30 mg via INTRAMUSCULAR
  Filled 2019-07-31: qty 2

## 2019-07-31 MED ORDER — CYCLOBENZAPRINE HCL 5 MG PO TABS
5.0000 mg | ORAL_TABLET | Freq: Once | ORAL | Status: AC
  Administered 2019-07-31: 5 mg via ORAL
  Filled 2019-07-31: qty 1

## 2019-07-31 NOTE — Discharge Instructions (Addendum)
You may use over-the-counter Motrin (Ibuprofen), Acetaminophen (Tylenol), topical muscle creams such as SalonPas, Icy Hot, Bengay, etc. Please stretch, apply heat, and have massage therapy for additional assistance. ° °

## 2019-07-31 NOTE — ED Triage Notes (Signed)
Brought in by [facility] for back pain. Pt states she has "degenerative disc disease" but per her mother and [facility] pt does not f/u with her physician and refused Tylenol or OTC meds prior to transport "because they'll give better meds at the hospital". Pt told RN on arrival "get me a body bag", and is moaning loudly and crying. No tears noted. VSS.

## 2019-07-31 NOTE — ED Provider Notes (Signed)
[facility] EMERGENCY DEPARTMENT Provider Note  CSN: [id number] Arrival date & time: 07/31/19 1607  Chief Complaint(s) Back Pain  HPI Rachel Calhoun is a 24 y.o. female with a history of chronic back pain and anxiety presents to the emergency department with exacerbation of her lower back pain.  She presents by EMS due to the severity of her pain.  Pain has been coming on for several days but was exacerbated earlier this evening and worsening throughout the night.  Patient has not taken any medicine at home.  Pain is worse with range of motion and movement.  She reports that the pain radiates down both legs.  She is endorsing numbness to both legs as well.  Reports that she cannot move her legs.  Denies any falls or trauma.  No recent fevers or infections.  No bladder/bowel incontinence.  No other physical complaints.  HPI  Past Medical History Past Medical History:  Diagnosis Date  . Anxiety   . Chronic back pain   . DDD (degenerative disc disease), lumbar   . Gallstones   . Sciatica    Patient Active Problem List   Diagnosis Date Noted  . Severe recurrent major depression without psychotic features ([facility]) 02/19/2019  . Generalized anxiety disorder with panic attacks 02/19/2019  . Paronychia 12/18/2012   Home Medication(s) Prior to Admission medications   Medication Sig Start Date End Date Taking? Authorizing Provider  FLUoxetine (PROZAC) 20 MG capsule Take 1 capsule (20 mg total) by mouth daily. 02/22/19   Connye Burkitt, NP  pantoprazole (PROTONIX) 20 MG tablet Take 1 tablet (20 mg total) by mouth daily. 02/22/19   Connye Burkitt, NP  traZODone (DESYREL) 50 MG tablet Take 1 tablet (50 mg total) by mouth at bedtime as needed for sleep. 02/21/19   Connye Burkitt, NP                                                                                                                                    Past Surgical History Past Surgical History:  Procedure Laterality Date  .  CHOLECYSTECTOMY    . HERNIA REPAIR    . I & D EXTREMITY Right 12/18/2012   Procedure: IRRIGATION AND DEBRIDEMENT RIGHT RING FINGER;  Surgeon: Tennis Must, MD;  Location: [facility];  Service: Orthopedics;  Laterality: Right;   Family History No family history on file.  Social History Social History   Tobacco Use  . Smoking status: Current Some Day Smoker    Packs/day: 0.25    Types: Cigarettes  . Smokeless tobacco: Never Used  Vaping Use  . Vaping Use: Some days  Substance Use Topics  . Alcohol use: Yes    Comment: occ  . Drug use: No   Allergies Belladonna alkaloids and Clindamycin/lincomycin  Review of Systems Review of Systems All other systems are reviewed and are negative for acute change except as noted in  the HPI  Physical Exam Vital Signs  I have reviewed the triage vital signs BP 114/86 (BP Location: Right Arm)   Pulse 86   Temp 98.5 F (36.9 C) (Oral)   Resp (!) 22   Wt 110.9 kg   SpO2 100%   BMI 44.72 kg/m   Physical Exam Vitals reviewed.  Constitutional:      General: She is not in acute distress.    Appearance: She is well-developed. She is not diaphoretic.     Comments: Screaming  In obvious discomfort   HENT:     Head: Normocephalic and atraumatic.     Right Ear: External ear normal.     Left Ear: External ear normal.     Nose: Nose normal.  Eyes:     General: No scleral icterus.    Conjunctiva/sclera: Conjunctivae normal.  Neck:     Trachea: Phonation normal.  Cardiovascular:     Rate and Rhythm: Normal rate and regular rhythm.  Pulmonary:     Effort: Pulmonary effort is normal. No respiratory distress.     Breath sounds: No stridor.  Abdominal:     General: There is no distension.  Musculoskeletal:        General: Normal range of motion.     Cervical back: Normal range of motion.     Lumbar back: Spasms and tenderness present. No bony tenderness.       Back:  Neurological:     Mental Status: She is alert and oriented to person,  place, and time.     Comments: Refuses to move lower extremities. Reports no sensation to BLE, but retracts briskly to pinprick with needle  Psychiatric:        Behavior: Behavior is agitated.     ED Results and Treatments Labs (all labs ordered are listed, but only abnormal results are displayed) Labs Reviewed - No data to display                                                                                                                       EKG  EKG Interpretation  Date/Time:    Ventricular Rate:    PR Interval:    QRS Duration:   QT Interval:    QTC Calculation:   R Axis:     Text Interpretation:        Radiology No results found.  Pertinent labs & imaging results that were available during my care of the patient were reviewed by me and considered in my medical decision making (see chart for details).  Medications Ordered in ED Medications  ketorolac (TORADOL) injection 30 mg (30 mg Intramuscular Given 07/31/19 0452)  cyclobenzaprine (FLEXERIL) tablet 5 mg (5 mg Oral Given 07/31/19 0452)  Procedures Procedures  (including critical care time)  Medical Decision Making / ED Course I have reviewed the nursing notes for this encounter and the patient's prior records (if available in EHR or on provided paperwork).   Teri Legacy Record was evaluated in Emergency Department on 07/31/2019 for the symptoms described in the history of present illness. She was evaluated in the context of the global COVID-19 pandemic, which necessitated consideration that the patient might be at risk for infection with the SARS-CoV-2 virus that causes COVID-19. Institutional protocols and algorithms that pertain to the evaluation of patients at risk for COVID-19 are in a state of rapid change based on information released by regulatory bodies including the CDC and federal  and state organizations. These policies and algorithms were followed during the patient's care in the ED.  24 y.o. female presents with back pain in lumbosacral area. No acute traumatic onset. No red flag symptoms of fever, weight loss, saddle anesthesia, weakness, fecal/urinary incontinence or urinary retention.   Suspect MSK etiology. No indication for imaging emergently.   Given IM Toradol and oral flexeril. On reassessment, pain had significantly improved.  Patient is now able to move her lower extremities and sensation has reportedly returned.  Patient was recommended to take short course of scheduled NSAIDs and engage in early mobility as definitive treatment. Return precautions discussed for worsening or new concerning symptoms.        Final Clinical Impression(s) / ED Diagnoses Final diagnoses:  Strain of lumbar region, initial encounter    The patient appears reasonably screened and/or stabilized for discharge and I doubt any other medical condition or other [facility] requiring further screening, evaluation, or treatment in the ED at this time prior to discharge. Safe for discharge with strict return precautions.  Disposition: Discharge  Condition: Good  I have discussed the results, Dx and Tx plan with the patient/family who expressed understanding and agree(s) with the plan. Discharge instructions discussed at length. The patient/family was given strict return precautions who verbalized understanding of the instructions. No further questions at time of discharge.    ED Discharge Orders    None      Follow Up: Primary care provider  Schedule an appointment as soon as possible for a visit  If you do not have a primary care physician, contact [facility] at [phone] for referral     This chart was dictated using voice recognition software.  Despite best efforts to proofread,  errors can occur which can change the documentation meaning.   Nira Conn,  MD 07/31/19 [phone]

## 2019-11-02 ENCOUNTER — Other Ambulatory Visit: Payer: Self-pay

## 2019-11-02 ENCOUNTER — Encounter (HOSPITAL_BASED_OUTPATIENT_CLINIC_OR_DEPARTMENT_OTHER): Payer: Self-pay | Admitting: *Deleted

## 2019-11-03 ENCOUNTER — Emergency Department (HOSPITAL_BASED_OUTPATIENT_CLINIC_OR_DEPARTMENT_OTHER)
Admission: EM | Admit: 2019-11-03 | Discharge: 2019-11-03 | Discharge status: Absent without leave | Payer: Medicaid Other

## 2020-07-06 ENCOUNTER — Other Ambulatory Visit: Payer: Self-pay

## 2020-07-06 ENCOUNTER — Encounter (HOSPITAL_BASED_OUTPATIENT_CLINIC_OR_DEPARTMENT_OTHER): Payer: Self-pay | Admitting: *Deleted

## 2020-07-06 ENCOUNTER — Emergency Department (HOSPITAL_BASED_OUTPATIENT_CLINIC_OR_DEPARTMENT_OTHER)
Admission: EM | Admit: 2020-07-06 | Discharge: 2020-07-06 | Disposition: A | Discharge status: Home / Self Care | Payer: Medicaid Other | Attending: Emergency Medicine | Admitting: Emergency Medicine

## 2020-07-06 DIAGNOSIS — S0100XA Unspecified open wound of scalp, initial encounter: Secondary | ICD-10-CM | POA: Insufficient documentation

## 2020-07-06 DIAGNOSIS — Z79899 Other long term (current) drug therapy: Secondary | ICD-10-CM | POA: Diagnosis not present

## 2020-07-06 DIAGNOSIS — F17210 Nicotine dependence, cigarettes, uncomplicated: Secondary | ICD-10-CM | POA: Insufficient documentation

## 2020-07-06 DIAGNOSIS — X58XXXA Exposure to other specified factors, initial encounter: Secondary | ICD-10-CM | POA: Insufficient documentation

## 2020-07-06 DIAGNOSIS — T656X1A Toxic effect of paints and dyes, not elsewhere classified, accidental (unintentional), initial encounter: Secondary | ICD-10-CM | POA: Diagnosis not present

## 2020-07-06 DIAGNOSIS — S0990XA Unspecified injury of head, initial encounter: Secondary | ICD-10-CM | POA: Diagnosis present

## 2020-07-06 MED ORDER — HYDROXYZINE HCL 25 MG PO TABS: 25.0000 mg | ORAL_TABLET | Freq: Four times a day (QID) | ORAL | 0 refills | Status: DC | PRN

## 2020-07-06 NOTE — ED Provider Notes (Signed)
[facility] EMERGENCY DEPARTMENT Provider Note   CSN: [id number] Arrival date & time: 07/06/20  1730     History Chief Complaint  Patient presents with  . Burn    Rachel Calhoun is a 25 y.o. female with no pertinent past medical history who presents today for evaluation of scalp wound. She states that yesterday she bleached her hair and then guided glue.  She only did this on the top of her head where her hair was long, not on the back or sides where she has cut it short. She states that today when she woke up running her fingers through her hair she felt multiple scabs. Last tetanus was within the past 5 years per chart review. She denies any fevers.  She has never had this reaction before. She reports itching.  HPI     Past Medical History:  Diagnosis Date  . Anxiety   . Chronic back pain   . DDD (degenerative disc disease), lumbar   . Gallstones   . Sciatica     Patient Active Problem List   Diagnosis Date Noted  . Severe recurrent major depression without psychotic features ([facility]) 02/19/2019  . Generalized anxiety disorder with panic attacks 02/19/2019  . Paronychia 12/18/2012    Past Surgical History:  Procedure Laterality Date  . CHOLECYSTECTOMY    . HERNIA REPAIR    . I & D EXTREMITY Right 12/18/2012   Procedure: IRRIGATION AND DEBRIDEMENT RIGHT RING FINGER;  Surgeon: Tami Ribas, MD;  Location: [facility] OR;  Service: Orthopedics;  Laterality: Right;     OB History    Gravida  1   Para      Term      Preterm      AB      Living        SAB      IAB      Ectopic      Multiple      Live Births              No family history on file.  Social History   Tobacco Use  . Smoking status: Current Some Day Smoker    Packs/day: 0.25    Types: Cigarettes  . Smokeless tobacco: Never Used  Vaping Use  . Vaping Use: Some days  Substance Use Topics  . Alcohol use: Yes    Comment: occ  . Drug use: No    Home Medications Prior to  Admission medications   Medication Sig Start Date End Date Taking? Authorizing Provider  hydrOXYzine (ATARAX/VISTARIL) 25 MG tablet Take 1 tablet (25 mg total) by mouth every 6 (six) hours as needed for itching. 07/06/20  Yes Cristina Gong, PA-C  FLUoxetine (PROZAC) 20 MG capsule Take 1 capsule (20 mg total) by mouth daily. 02/22/19   Aldean Baker, NP  pantoprazole (PROTONIX) 20 MG tablet Take 1 tablet (20 mg total) by mouth daily. 02/22/19   Aldean Baker, NP  traZODone (DESYREL) 50 MG tablet Take 1 tablet (50 mg total) by mouth at bedtime as needed for sleep. 02/21/19   Aldean Baker, NP    Allergies    Belladonna alkaloids and Clindamycin/lincomycin  Review of Systems   Review of Systems  Constitutional: Negative for fever.  Skin: Positive for wound.       Itching  All other systems reviewed and are negative.   Physical Exam Updated Vital Signs BP (!) 128/91   Pulse (!) 101  Temp 98.7 F (37.1 C) (Oral)   Resp 16   Ht 5\' 2"  (1.575 m)   Wt 108.9 kg   LMP 07/04/2020   SpO2 98%   BMI 43.90 kg/m   Physical Exam Vitals and nursing note reviewed.  Constitutional:      General: She is not in acute distress.    Appearance: She is not ill-appearing.  HENT:     Head: Normocephalic and atraumatic.     Comments: On the scalp primarily at the top of the head there are multiple approximately 1 cm scabs.  No lice or parasites visualized. There is no obvious secondary bacterial infection.  No abnormal drainage.  Scalp around scabs is normal color with out erythema.  No abnormal fluctuance or induration.  Cardiovascular:     Rate and Rhythm: Normal rate.  Pulmonary:     Effort: Pulmonary effort is normal. No respiratory distress.  Skin:    Comments: Please see HEENT section  Neurological:     Mental Status: She is alert.     ED Results / Procedures / Treatments   Labs (all labs ordered are listed, but only abnormal results are displayed) Labs Reviewed - No data to  display  EKG None  Radiology No results found.  Procedures Procedures   Medications Ordered in ED Medications - No data to display  ED Course  I have reviewed the triage vital signs and the nursing notes.  Pertinent labs & imaging results that were available during my care of the patient were reviewed by me and considered in my medical decision making (see chart for details).    MDM Rules/Calculators/A&P                         Patient is a otherwise healthy 25 year old woman who presents today for evaluation of multiple scalp wounds that she suspects is secondary to chemical burn from bleaching and then dying her hair. On exam she does have multiple scalp wounds that are superficial, scabbed over.  There is no obvious secondary bacterial infection at this time.  Her primary concern today is itching.  She is given a prescription for Atarax with instructions on the safe use of this and states her understanding.  Recommended general conservative care, and continued monitoring.  Return precautions were discussed with patient who states their understanding.  At the time of discharge patient denied any unaddressed complaints or concerns.  Patient is agreeable for discharge home.  Note: Portions of this report may have been transcribed using voice recognition software. Every effort was made to ensure accuracy; however, inadvertent computerized transcription errors may be present  Final Clinical Impression(s) / ED Diagnoses Final diagnoses:  Open wound of scalp, unspecified open wound type, initial encounter    Rx / DC Orders ED Discharge Orders         Ordered    hydrOXYzine (ATARAX/VISTARIL) 25 MG tablet  Every 6 hours PRN        07/06/20 1758           09/05/20, PA-C 07/07/20 0043    09/06/20, DO 07/07/20 1824

## 2020-07-06 NOTE — ED Triage Notes (Signed)
C/o chemical burn from hair dye x 1 day

## 2020-07-06 NOTE — Discharge Instructions (Signed)
As we discussed today I would recommend that you do not bleach/dry your hair as it appears to have contributed to your wounds today. Please rinse your scalp and clean it with either your shampoo or baby shampoo to make sure that all of the product has been removed.  If product remains on your skin it can cause continued damage.  Please monitor the area. If you get significant abnormal redness, pain, discharge of anything that looks like it would be coming out of a whitehead, or have any other concerns please seek additional medical care and evaluation.  You are being prescribed a medication which may make you sleepy. For 24 hours after one dose please do not drive, operate heavy machinery, care for a small child with out another adult present, or perform any activities that may cause harm to you or someone else if you were to fall asleep or be impaired.   Please take Ibuprofen (Advil, motrin) and Tylenol (acetaminophen) to relieve your pain.    You may take up to 600 MG (3 pills) of normal strength ibuprofen every 8 hours as needed.   You make take tylenol, up to 1,000 mg (two extra strength pills) every 8 hours as needed.   It is safe to take ibuprofen and tylenol at the same time as they work differently.   Do not take more than 3,000 mg tylenol in a 24 hour period (not more than one dose every 8 hours.  Please check all medication labels as many medications such as pain and cold medications may contain tylenol.  Do not drink alcohol while taking these medications.  Do not take other NSAID'S while taking ibuprofen (such as aleve or naproxen).  Please take ibuprofen with food to decrease stomach upset.

## 2022-01-28 ENCOUNTER — Other Ambulatory Visit: Payer: Self-pay

## 2022-01-28 ENCOUNTER — Emergency Department (HOSPITAL_COMMUNITY): Payer: Medicaid Other

## 2022-01-28 ENCOUNTER — Encounter (HOSPITAL_COMMUNITY): Payer: Self-pay

## 2022-01-28 ENCOUNTER — Emergency Department (HOSPITAL_COMMUNITY)
Admission: EM | Admit: 2022-01-28 | Discharge: 2022-01-28 | Disposition: A | Discharge status: Home / Self Care | Payer: Medicaid Other | Attending: Emergency Medicine | Admitting: Emergency Medicine

## 2022-01-28 DIAGNOSIS — M25551 Pain in right hip: Secondary | ICD-10-CM | POA: Diagnosis not present

## 2022-01-28 DIAGNOSIS — M546 Pain in thoracic spine: Secondary | ICD-10-CM | POA: Diagnosis not present

## 2022-01-28 DIAGNOSIS — W010XXA Fall on same level from slipping, tripping and stumbling without subsequent striking against object, initial encounter: Secondary | ICD-10-CM | POA: Insufficient documentation

## 2022-01-28 DIAGNOSIS — R1030 Lower abdominal pain, unspecified: Secondary | ICD-10-CM | POA: Insufficient documentation

## 2022-01-28 DIAGNOSIS — W19XXXA Unspecified fall, initial encounter: Secondary | ICD-10-CM

## 2022-01-28 DIAGNOSIS — M25552 Pain in left hip: Secondary | ICD-10-CM | POA: Insufficient documentation

## 2022-01-28 DIAGNOSIS — M545 Low back pain, unspecified: Secondary | ICD-10-CM | POA: Diagnosis present

## 2022-01-28 DIAGNOSIS — R29898 Other symptoms and signs involving the musculoskeletal system: Secondary | ICD-10-CM

## 2022-01-28 LAB — COMPREHENSIVE METABOLIC PANEL
ALT: 20 U/L (ref 0–44)
AST: 17 U/L (ref 15–41)
Albumin: 4.1 g/dL (ref 3.5–5.0)
Alkaline Phosphatase: 52 U/L (ref 38–126)
Anion gap: 7 (ref 5–15)
BUN: 14 mg/dL (ref 6–20)
CO2: 22 mmol/L (ref 22–32)
Calcium: 9 mg/dL (ref 8.9–10.3)
Chloride: 110 mmol/L (ref 98–111)
Creatinine, Ser: 0.72 mg/dL (ref 0.44–1.00)
GFR, Estimated: >60 mL/min (ref >60)
Glucose, Bld: 97 mg/dL (ref 70–99)
Potassium: 3.8 mmol/L (ref 3.5–5.1)
Sodium: 139 mmol/L (ref 135–145)
Total Bilirubin: 1.3 mg/dL — ABNORMAL HIGH (ref 0.3–1.2)
Total Protein: 6.9 g/dL (ref 6.5–8.1)

## 2022-01-28 LAB — CBC WITH DIFFERENTIAL/PLATELET
Abs Immature Granulocytes: 0.02 10*3/uL (ref 0.00–0.07)
Basophils Absolute: 0 10*3/uL (ref 0.0–0.1)
Basophils Relative: 0 %
Eosinophils Absolute: 0.2 10*3/uL (ref 0.0–0.5)
Eosinophils Relative: 2 %
HCT: 42.2 % (ref 36.0–46.0)
Hemoglobin: 15 g/dL (ref 12.0–15.0)
Immature Granulocytes: 0 %
Lymphocytes Relative: 32 %
Lymphs Abs: 2.9 10*3/uL (ref 0.7–4.0)
MCH: 31.6 pg (ref 26.0–34.0)
MCHC: 35.5 g/dL (ref 30.0–36.0)
MCV: 89 fL (ref 80.0–100.0)
Monocytes Absolute: 0.6 10*3/uL (ref 0.1–1.0)
Monocytes Relative: 6 %
Neutro Abs: 5.4 10*3/uL (ref 1.7–7.7)
Neutrophils Relative %: 60 %
Platelets: 318 10*3/uL (ref 150–400)
RBC: 4.74 MIL/uL (ref 3.87–5.11)
RDW: 12.2 % (ref 11.5–15.5)
WBC: 9.1 10*3/uL (ref 4.0–10.5)
nRBC: 0 % (ref 0.0–0.2)

## 2022-01-28 LAB — I-STAT BETA HCG BLOOD, ED (MC, WL, AP ONLY): I-stat hCG, quantitative: <5 m[IU]/mL (ref <5)

## 2022-01-28 LAB — I-STAT CHEM 8, ED
BUN: 15 mg/dL (ref 6–20)
Calcium, Ion: 1.16 mmol/L (ref 1.15–1.40)
Chloride: 107 mmol/L (ref 98–111)
Creatinine, Ser: 0.6 mg/dL (ref 0.44–1.00)
Glucose, Bld: 98 mg/dL (ref 70–99)
HCT: 42 % (ref 36.0–46.0)
Hemoglobin: 14.3 g/dL (ref 12.0–15.0)
Potassium: 4 mmol/L (ref 3.5–5.1)
Sodium: 140 mmol/L (ref 135–145)
TCO2: 22 mmol/L (ref 22–32)

## 2022-01-28 MED ORDER — METOCLOPRAMIDE HCL 5 MG/ML IJ SOLN
5.0000 mg | Freq: Once | INTRAMUSCULAR | Status: AC
  Administered 2022-01-28: 5 mg via INTRAVENOUS
  Filled 2022-01-28: qty 2

## 2022-01-28 MED ORDER — IOHEXOL 350 MG/ML SOLN
75.0000 mL | Freq: Once | INTRAVENOUS | Status: AC | PRN
  Administered 2022-01-28: 75 mL via INTRAVENOUS

## 2022-01-28 MED ORDER — FENTANYL CITRATE PF 50 MCG/ML IJ SOSY
75.0000 ug | PREFILLED_SYRINGE | INTRAMUSCULAR | Status: AC | PRN
  Administered 2022-01-28 (×2): 75 ug via INTRAVENOUS
  Filled 2022-01-28 (×2): qty 2

## 2022-01-28 MED ORDER — ONDANSETRON HCL 4 MG/2ML IJ SOLN
4.0000 mg | Freq: Once | INTRAMUSCULAR | Status: DC
  Filled 2022-01-28: qty 2

## 2022-01-28 MED ORDER — HYDROMORPHONE HCL 1 MG/ML IJ SOLN
0.5000 mg | Freq: Once | INTRAMUSCULAR | Status: AC
  Administered 2022-01-28: 0.5 mg via INTRAVENOUS
  Filled 2022-01-28: qty 1

## 2022-01-28 NOTE — ED Notes (Signed)
Patient transported to CT by Donnal Debar, verbal order to bypass labs Dr Shanon Rosser PA.

## 2022-01-28 NOTE — ED Notes (Signed)
Pt given drink and snack per PA.

## 2022-01-28 NOTE — Discharge Instructions (Signed)
As discussed, your lab work and imaging including CT scans and MRIs was reassuring and did not show a broken bone or other acute reason to explain your symptoms today.  It is reassuring that you showed significant improvement after treating your pain and had better sensation and motor function of your legs.  It is very important you follow-up with neurology to try and find a cause for why you keep having these symptoms. I have provided the information of a neurologist for you to contact to arrange this appointment.  Should your leg weakness return or you develop other symptoms such as weakness in the arms, weakness in your face, confusion, change in your speech, or re-injure your head, neck, or back, it is important you be re-evaluated at the closest emergency department.

## 2022-01-28 NOTE — ED Notes (Signed)
Trauma Response Nurse Documentation  Rachel Calhoun is a 26 y.o. female arriving to [facility] ED via EMS  Trauma was activated as a Level 2 based on the following trauma criteria Paralysis associated with trauma,. Trauma team at the bedside on patient arrival.   Patient cleared for CT by Dr. Rubin Payor. Pt transported to CT with trauma response nurse present to monitor. RN remained with the patient throughout their absence from the department for clinical observation.   Per patient she was reaching down to grab something when she had sudden lower back pain causing her to fall over. After fall she has been unable to move her legs. Complaining of severe pain/spasms in lower back, received of fentanyl total with little improvement. C-collar placed by EMS. GCS 15.  History   Past Medical History:  Diagnosis Date   Anxiety    Chronic back pain    DDD (degenerative disc disease), lumbar    Gallstones    Sciatica      Past Surgical History:  Procedure Laterality Date   CHOLECYSTECTOMY     HERNIA REPAIR     I & D EXTREMITY Right 12/18/2012   Procedure: IRRIGATION AND DEBRIDEMENT RIGHT RING FINGER;  Surgeon: Tami Ribas, MD;  Location: [facility] OR;  Service: Orthopedics;  Laterality: Right;     Initial Focused Assessment (If applicable, or please see trauma documentation): Patient A&Ox4, GCS 15 Per patient unable to move LE, no sensation below waist No movement to painful stimulus  CT's Completed:   CT Head, CT C-Spine, CT Chest w/ contrast, and CT abdomen/pelvis w/ contrast   Interventions:  IV, labs CT Head/Cspine/C/A/P MRI T/L spine  Event Summary: Patient brought to ED after an injury to her back at home, causing her to fall and hit her head. Per patient was unable to move her bilateral legs after injury. Imaging has all resulted including MRI of Lspine and is negative for injury. Patient VSS, no additional needs from trauma at this time.  Bedside handoff with ED RN Hannie.     Jill Side Cerenity Goshorn  Trauma Response RN  Please call TRN at [phone] for further assistance.

## 2022-01-28 NOTE — ED Notes (Signed)
EDP at bedside  

## 2022-01-28 NOTE — ED Notes (Signed)
Patient transported to CT 

## 2022-01-28 NOTE — ED Provider Notes (Signed)
[facility] EMERGENCY DEPARTMENT Provider Note   CSN: [id number] Arrival date & time: 01/28/22  1430     History  Chief Complaint  Patient presents with   Fall   Numbness    Rachel Calhoun is a 26 y.o. female.  HPI  Patient is a 26 year old female with no pertinent past medical history she is present emergency room today after she had a fall from standing  She is not on any anticoagulation  Seems that she was bending forwards to pet her dog straightened up and had some low back pain fell to the ground striking her head and was not able to move her legs.  Was brought by EMS who provided 200 mcg of fentanyl and route with minimal improvement in pain.  She is unable for her legs.  She is complaining primarily of bilateral hip pain and back pain.  She has not had any bleeding, per EMS report had CBG of 150      Home Medications Prior to Admission medications   Medication Sig Start Date End Date Taking? Authorizing Provider  FLUoxetine (PROZAC) 20 MG capsule Take 1 capsule (20 mg total) by mouth daily. 02/22/19   Aldean Baker, NP  hydrOXYzine (ATARAX/VISTARIL) 25 MG tablet Take 1 tablet (25 mg total) by mouth every 6 (six) hours as needed for itching. 07/06/20   Cristina Gong, PA-C  pantoprazole (PROTONIX) 20 MG tablet Take 1 tablet (20 mg total) by mouth daily. 02/22/19   Aldean Baker, NP  traZODone (DESYREL) 50 MG tablet Take 1 tablet (50 mg total) by mouth at bedtime as needed for sleep. 02/21/19   Aldean Baker, NP      Allergies    Belladonna alkaloids and Clindamycin/lincomycin    Review of Systems   Review of Systems  Physical Exam Updated Vital Signs BP 102/68   Pulse 68   Temp 98.3 F (36.8 C)   Resp (!) 21   Ht 5\' 2"  (1.575 m)   Wt 86.2 kg   SpO2 100%   BMI 34.75 kg/m  Physical Exam Vitals and nursing note reviewed.  Constitutional:      General: She is in acute distress.  HENT:     Head: Normocephalic and atraumatic.      Nose: Nose normal.     Mouth/Throat:     Mouth: Mucous membranes are moist.  Eyes:     General: No scleral icterus. Cardiovascular:     Rate and Rhythm: Normal rate and regular rhythm.     Pulses: Normal pulses.     Heart sounds: Normal heart sounds.  Pulmonary:     Effort: Pulmonary effort is normal. No respiratory distress.     Breath sounds: No wheezing.     Comments: Bilateral lung sounds normal Abdominal:     Palpations: Abdomen is soft.     Tenderness: There is abdominal tenderness. There is no guarding or rebound.     Comments: Mild suprapubic tenderness  Musculoskeletal:     Cervical back: Normal range of motion.     Right lower leg: No edema.     Left lower leg: No edema.     Comments: Patient with bilateral hip tenderness, exam somewhat limited for lower extremities with no sensation unable to assess whether there is tenderness present however no bony deformity bruising or abrasions or lacerations to lower extremities.  No upper extremity tenderness, no C-spine tenderness through c-collar.  No evidence of head trauma in the  form of lacerations abrasions or obvious contusions.  Tenderness to palpation of lower thoracic and upper lumbar spine no bruising  Bilateral patellar reflexes 3+   Skin:    General: Skin is warm and dry.     Capillary Refill: Capillary refill takes less than 2 seconds.  Neurological:     Mental Status: She is alert. Mental status is at baseline.  Psychiatric:        Mood and Affect: Mood normal.        Behavior: Behavior normal.     ED Results / Procedures / Treatments   Labs (all labs ordered are listed, but only abnormal results are displayed) Labs Reviewed  COMPREHENSIVE METABOLIC PANEL  CBC WITH DIFFERENTIAL/PLATELET  I-STAT CHEM 8, ED  I-STAT BETA HCG BLOOD, ED ([facility], [facility], [facility] ONLY)    EKG None  Radiology DG Chest Portable 1 View  Result Date: 01/28/2022 CLINICAL DATA:  Pain after fall EXAM: PORTABLE CHEST 1 VIEW COMPARISON:   March 24, 2018 FINDINGS: The heart size and mediastinal contours are within normal limits. Both lungs are clear. The visualized skeletal structures are unremarkable. IMPRESSION: No active disease. Electronically Signed   By: Gerome Sam III M.D.   On: 01/28/2022 15:30   DG Pelvis Portable  Result Date: 01/28/2022 CLINICAL DATA:  Pain after fall EXAM: PORTABLE PELVIS 1-2 VIEWS COMPARISON:  None Available. FINDINGS: Oval density in the left superior acetabular region is likely a bone island. No fractures or other abnormalities. IMPRESSION: Probable bone island in the left superior acetabular region. No fractures. No other acute abnormalities. Electronically Signed   By: Gerome Sam III M.D.   On: 01/28/2022 15:29    Procedures .Critical Care  Performed by: Gailen Shelter, PA Authorized by: Gailen Shelter, PA   Critical care provider statement:    Critical care time (minutes):  35   Critical care time was exclusive of:  Separately billable procedures and treating other patients and teaching time   Critical care was necessary to treat or prevent imminent or life-threatening deterioration of the following conditions:  Trauma   Critical care was time spent personally by me on the following activities:  Development of treatment plan with patient or surrogate, review of old charts, re-evaluation of patient's condition, pulse oximetry, ordering and review of radiographic studies, ordering and review of laboratory studies, ordering and performing treatments and interventions, obtaining history from patient or surrogate, examination of patient and evaluation of patient's response to treatment   Care discussed with: admitting provider       Medications Ordered in ED Medications  fentaNYL (SUBLIMAZE) injection 75 mcg (75 mcg Intravenous Given 01/28/22 1503)  iohexol (OMNIPAQUE) 350 MG/ML injection 75 mL (75 mLs Intravenous Contrast Given 01/28/22 1526)    ED Course/ Medical Decision  Making/ A&P                           Medical Decision Making Amount and/or Complexity of Data Reviewed Labs: ordered. Radiology: ordered.  Risk Prescription drug management.   This patient presents to the ED for concern of trama, this involves a number of treatment options, and is a complaint that carries with it a high risk of complications and morbidity. A differential diagnosis was considered for the patient's symptoms   Co morbidities: Discussed in HPI   Brief History:  Patient is a 26 year old female with no pertinent past medical history she is present emergency room today after she had  a fall from standing  She is not on any anticoagulation  Seems that she was bending forwards to pet her dog straightened up and had some low back pain fell to the ground striking her head and was not able to move her legs.  Was brought by EMS who provided 200 mcg of fentanyl and route with minimal improvement in pain.  She is unable for her legs.  She is complaining primarily of bilateral hip pain and back pain.  She has not had any bleeding, per EMS report had CBG of 150    EMR reviewed including pt PMHx, past surgical history and past visits to ER.   See HPI for more details   Lab Tests:   I ordered and independently interpreted labs. Labs notable for I-STAT Chem-8 unremarkable  Imaging Studies:  CT images pending at this time X-ray pelvis and chest without any pneumothorax, pelvic fracture or any other acute abnormal finding   Cardiac Monitoring:  The patient was maintained on a cardiac monitor.  I personally viewed and interpreted the cardiac monitored which showed an underlying rhythm of: SR NA   Medicines ordered:  I ordered medication including fentanyl for pain Reevaluation of the patient after these medicines showed that the patient improved I have reviewed the patients home medicines and have made adjustments as needed   Critical  Interventions:     Consults/Attending Physician   I discussed this case with my attending physician who cosigned this note including patient's presenting symptoms, physical exam, and planned diagnostics and interventions. Attending physician stated agreement with plan or made changes to plan which were implemented.   Attending physician assessed patient at bedside.    Reevaluation:  After the interventions noted above I re-evaluated patient and found that they have :stayed the same   Social Determinants of Health:      Problem List / ED Course:  Trauma with bilateral leg paralysis.  CT imaging pending.    Dispostion:  3:39 PM Care of @PATIENTNAME @ transferred to PA MG and Dr. at the end of my shift as the patient will require reassessment once labs/imaging have resulted. Patient presentation, ED course, and plan of care discussed with review of all pertinent labs and imaging. Please see his/her note for further details regarding further ED course and disposition. Plan at time of handoff is follow-up on CT imaging may require MRI given her exam. This may be altered or completely changed at the discretion of the oncoming team pending results of further workup.    Final Clinical Impression(s) / ED Diagnoses Final diagnoses:  Trauma    Rx / DC Orders ED Discharge Orders     None         Durwin Nora, Gailen Shelter 01/28/22 1540    01/30/22, MD 01/29/22 1531

## 2022-01-28 NOTE — ED Notes (Signed)
Patient transported to MRI 

## 2022-01-28 NOTE — ED Notes (Signed)
Pt to MRI

## 2022-01-28 NOTE — ED Provider Notes (Signed)
Pt handoff from day shift PA, [facility], pending CT imaging of spine with likelihood of needing MRI if CT negative. Pt had onset of severe lower back pain today causing her to fall down on her right side with resultant weakness in bilateral lower extremities. Pt reports she has not been able to walk or move her legs since that time. She received multiple doses of Fentanyl without relief of her pain by the time I examined her. She had exquisite tenderness to midline lower thoracic and lumbar spine as well as diffusely across lumbar region. No response to painful stimuli of lower extremities. Bilateral patellar reflexes 2+. In moderate distress secondary to pain. No c-spine tenderness and with clear CT scan of cervical spine was able to clear c-collar. No explanation for symptoms identified on CT scan, however, pt with persistent symptoms so decision was made to proceed with MRI thoracic and lumbar spine. Pt remained with decreased sensation from bilateral thighs down lower extremities and the same lower extremity weakness as before. She was transported to MRI where they were able to complete lumbar scan but needed to be transported back to ED without completion of thoracic scan due to uncontrolled pain. Blood pressure cuff repositioned (no medication given prior to MRI transfer due to low blood pressures) and pt then noted to be normotensive so proceeded with dose of Dilaudid 0.5mg  for pain control as well as Reglan for nausea. Pt with good pain relief following these medications and able to be re-transported back to MRI scan with completion of thoracic spine imaging. No findings on MRI thoracic or lumbar spine to explain pt's symptoms. After transport back to ED following scan, pt able to transfer herself on and off bed pan to use restroom and on my recheck she is sitting up in bed, using her cell phone, and has both legs folded underneath her. She reports her pain is very well controlled after the Dilaudid and  she is regaining sensation in the lower extremities. Reports to move legs underneath her to get in current position she needed assistance of her arms and could not move legs on her own. When I attempt to extend both her lower extremities before reaching full extension pt begins to actively extend on her own accord without signs of paralysis or acute pain. Reports she now has sensation on neurological exam and is withdrawing to painful stimuli of lower extremities. Pt/mother report this has happened a few times before but pt has never been evaluated by a neurologist or in the emergency department for these symptoms. Discussed case with attending MD, Dr. Gloris Manchester, who agreed with no findings on scans, significant improvement in physical exam, and pt's presentation, pt is safe to be discharged home with strict instruction to follow-up with neurology outpatient. Pt provided with this information for follow-up as well as strict return precautions for worsening or return of symptoms, new trauma, or other concerns. Mother at bedside also aware of treatment plan. Pt stable for discharge.    Richardson Dopp 01/28/22 2302    Gloris Manchester, MD 01/29/22 2312

## 2022-01-28 NOTE — ED Provider Notes (Cosign Needed Addendum)
Patient emergently needs CT imaging given her lower extremity paralysis and numbness  We will not wait for creatinine/i-STAT hCG.  Patient states that she is not pregnant and has nexplanon.    Gailen Shelter, [place] 01/28/22 1502    Gailen Shelter, [place] 01/28/22 1502    Benjiman Core, MD 01/29/22 [phone]

## 2022-01-28 NOTE — ED Notes (Signed)
Pt unabl to tolerate MRI due to pain; EDP notified

## 2022-01-28 NOTE — ED Notes (Signed)
Pt returned from MRI. Pt having more sensation in legs than previously, some movement.

## 2022-01-28 NOTE — ED Triage Notes (Signed)
Pt bib ems from home; pt bent over ot pick up dog, collapsed d/t back pain; hx sciatica; fell back, hit head; no loc, not on thinners; c/lo low back pain, numbness from waist down; c collar in place; pedal pulses present; no reaction to pain in bilateral LE; c/o some pelvic pain, stable pelvis with ems; 200 mcg fentanyl given pta; 130/80, P 66 NSR, cbg 136, RR 20, 98% RA; no deformities noted

## 2023-04-22 ENCOUNTER — Emergency Department (HOSPITAL_BASED_OUTPATIENT_CLINIC_OR_DEPARTMENT_OTHER)
Admission: EM | Admit: 2023-04-22 | Discharge: 2023-04-23 | Disposition: A | Discharge status: Home / Self Care | Attending: Emergency Medicine | Admitting: Emergency Medicine

## 2023-04-22 ENCOUNTER — Encounter (HOSPITAL_BASED_OUTPATIENT_CLINIC_OR_DEPARTMENT_OTHER): Payer: Self-pay

## 2023-04-22 ENCOUNTER — Other Ambulatory Visit: Payer: Self-pay

## 2023-04-22 DIAGNOSIS — U071 COVID-19: Secondary | ICD-10-CM | POA: Insufficient documentation

## 2023-04-22 DIAGNOSIS — R55 Syncope and collapse: Secondary | ICD-10-CM | POA: Insufficient documentation

## 2023-04-22 DIAGNOSIS — M791 Myalgia, unspecified site: Secondary | ICD-10-CM | POA: Diagnosis present

## 2023-04-22 LAB — CBG MONITORING, ED: Glucose-Capillary: 102 mg/dL — ABNORMAL HIGH (ref 70–99)

## 2023-04-22 LAB — COMPREHENSIVE METABOLIC PANEL
ALT: 19 U/L (ref 0–44)
AST: 18 U/L (ref 15–41)
Albumin: 4.3 g/dL (ref 3.5–5.0)
Alkaline Phosphatase: 52 U/L (ref 38–126)
Anion gap: 11 (ref 5–15)
BUN: 11 mg/dL (ref 6–20)
CO2: 17 mmol/L — ABNORMAL LOW (ref 22–32)
Calcium: 9 mg/dL (ref 8.9–10.3)
Chloride: 107 mmol/L (ref 98–111)
Creatinine, Ser: 0.74 mg/dL (ref 0.44–1.00)
GFR, Estimated: >60 mL/min (ref >60)
Glucose, Bld: 109 mg/dL — ABNORMAL HIGH (ref 70–99)
Potassium: 3.2 mmol/L — ABNORMAL LOW (ref 3.5–5.1)
Sodium: 135 mmol/L (ref 135–145)
Total Bilirubin: 1.6 mg/dL — ABNORMAL HIGH (ref 0.0–1.2)
Total Protein: 7.7 g/dL (ref 6.5–8.1)

## 2023-04-22 LAB — CBC WITH DIFFERENTIAL/PLATELET
Abs Immature Granulocytes: 0.06 10*3/uL (ref 0.00–0.07)
Basophils Absolute: 0 10*3/uL (ref 0.0–0.1)
Basophils Relative: 0 %
Eosinophils Absolute: 0 10*3/uL (ref 0.0–0.5)
Eosinophils Relative: 0 %
HCT: 43.3 % (ref 36.0–46.0)
Hemoglobin: 15.3 g/dL — ABNORMAL HIGH (ref 12.0–15.0)
Immature Granulocytes: 1 %
Lymphocytes Relative: 11 %
Lymphs Abs: 1.2 10*3/uL (ref 0.7–4.0)
MCH: 31.2 pg (ref 26.0–34.0)
MCHC: 35.3 g/dL (ref 30.0–36.0)
MCV: 88.4 fL (ref 80.0–100.0)
Monocytes Absolute: 1.1 10*3/uL — ABNORMAL HIGH (ref 0.1–1.0)
Monocytes Relative: 11 %
Neutro Abs: 8.1 10*3/uL — ABNORMAL HIGH (ref 1.7–7.7)
Neutrophils Relative %: 77 %
Platelets: 239 10*3/uL (ref 150–400)
RBC: 4.9 MIL/uL (ref 3.87–5.11)
RDW: 12.7 % (ref 11.5–15.5)
WBC: 10.5 10*3/uL (ref 4.0–10.5)
nRBC: 0 % (ref 0.0–0.2)

## 2023-04-22 LAB — HCG, SERUM, QUALITATIVE: Preg, Serum: NEGATIVE

## 2023-04-22 MED ORDER — SODIUM CHLORIDE 0.9 % IV BOLUS
1000.0000 mL | Freq: Once | INTRAVENOUS | Status: AC
  Administered 2023-04-23: 1000 mL via INTRAVENOUS

## 2023-04-22 MED ORDER — HALOPERIDOL LACTATE 5 MG/ML IJ SOLN
2.0000 mg | Freq: Once | INTRAMUSCULAR | Status: AC
  Administered 2023-04-23: 2 mg via INTRAVENOUS
  Filled 2023-04-22: qty 1

## 2023-04-22 MED ORDER — KETOROLAC TROMETHAMINE 15 MG/ML IJ SOLN
15.0000 mg | Freq: Once | INTRAMUSCULAR | Status: AC
  Administered 2023-04-23: 15 mg via INTRAVENOUS
  Filled 2023-04-22: qty 1

## 2023-04-22 NOTE — ED Triage Notes (Addendum)
 BIB by [facility] Paramedic reports she would not speak to them- tachypnea, bp 154/100, ST 106, 97% on room air CBG 106 Mom reports she witnessed her passing out and began to have convulsions. Mom states he was dx with covid today +nausea, body aches Pt does have a hx of anxiety Pt did not hit head when she passed out Crying continuously in triage

## 2023-04-22 NOTE — ED Provider Notes (Signed)
 [facility] DEPT [facility] Emergency Department Provider Note MRN:  [id number]  Arrival date & time: 04/23/23     Chief Complaint   syncopal epiode   History of Present Illness   Rachel Calhoun is a 28 y.o. year-old female with a history of anxiety presenting to the ED with chief complaint of syncopal episode.  Diagnosed with COVID today via home test.  Has been feeling generally unwell for the past day or 2.  Minimal p.o. intake during this time, body aches, nausea, cough, malaise.  Passed out and mom helped her to the ground, no head trauma.  Now feels like she is dying, moaning, just feels really bad.  Review of Systems  A thorough review of systems was obtained and all systems are negative except as noted in the HPI and PMH.   Patient's Health History    Past Medical History:  Diagnosis Date   Anxiety    Chronic back pain    DDD (degenerative disc disease), lumbar    Gallstones    Sciatica     Past Surgical History:  Procedure Laterality Date   CHOLECYSTECTOMY     HERNIA REPAIR     I & D EXTREMITY Right 12/18/2012   Procedure: IRRIGATION AND DEBRIDEMENT RIGHT RING FINGER;  Surgeon: Tami Ribas, MD;  Location: [facility] OR;  Service: Orthopedics;  Laterality: Right;    History reviewed. No pertinent family history.  Social History   Socioeconomic History   Marital status: Single    Spouse name: Not on file   Number of children: Not on file   Years of education: Not on file   Highest education level: Not on file  Occupational History   Not on file  Tobacco Use   Smoking status: Not on file   Smokeless tobacco: Never  Vaping Use   Vaping status: Some Days  Substance and Sexual Activity   Alcohol use: Yes    Comment: occ   Drug use: Yes    Types: Marijuana    Comment: not recently   Sexual activity: Yes    Birth control/protection: Implant  Other Topics Concern   Not on file  Social History Narrative   Not on file   Social Drivers of Health    Financial Resource Strain: Not on file  Food Insecurity: Not on file  Transportation Needs: Not on file  Physical Activity: Not on file  Stress: Not on file  Social Connections: Not on file  Intimate Partner Violence: Not on file     Physical Exam   Vitals:   04/22/23 2257  BP: (!) 135/103  Pulse: 91  Resp: 20  Temp: 99.6 F (37.6 C)  SpO2: 96%    CONSTITUTIONAL: Well-appearing, moaning in discomfort NEURO/PSYCH:  Alert and oriented x 3, no focal deficits, no meningismus EYES:  eyes equal and reactive ENT/NECK:  no LAD, no JVD CARDIO: Regular rate, well-perfused, normal S1 and S2 PULM:  CTAB no wheezing or rhonchi GI/GU:  non-distended, non-tender MSK/SPINE:  No gross deformities, no edema SKIN:  no rash, atraumatic   *Additional and/or pertinent findings included in MDM below  Diagnostic and Interventional Summary    EKG Interpretation Date/Time:  Wednesday April 22 2023 23:10:46 EDT Ventricular Rate:  97 PR Interval:  136 QRS Duration:  80 QT Interval:  356 QTC Calculation: 452 R Axis:   81  Text Interpretation: Normal sinus rhythm with sinus arrhythmia Nonspecific ST abnormality Abnormal ECG When compared with ECG of 31-Jan-2018 21:25,  PREVIOUS ECG IS PRESENT Confirmed by Kennis Carina [phone]) on 04/22/2023 11:38:25 PM       Labs Reviewed  COMPREHENSIVE METABOLIC PANEL - Abnormal; Notable for the following components:      Result Value   Potassium 3.2 (*)    CO2 17 (*)    Glucose, Bld 109 (*)    Total Bilirubin 1.6 (*)    All other components within normal limits  CBC WITH DIFFERENTIAL/PLATELET - Abnormal; Notable for the following components:   Hemoglobin 15.3 (*)    Neutro Abs 8.1 (*)    Monocytes Absolute 1.1 (*)    All other components within normal limits  CBG MONITORING, ED - Abnormal; Notable for the following components:   Glucose-Capillary 102 (*)    All other components within normal limits  HCG, SERUM, QUALITATIVE  PREGNANCY, URINE   CBC  URINALYSIS, ROUTINE W REFLEX MICROSCOPIC    No orders to display    Medications  sodium chloride 0.9 % bolus 1,000 mL (0 mLs Intravenous Stopped 04/23/23 0105)  haloperidol lactate (HALDOL) injection 2 mg (2 mg Intravenous Given 04/23/23 0004)  ketorolac (TORADOL) 15 MG/ML injection 15 mg (15 mg Intravenous Given 04/23/23 0004)     Procedures  /  Critical Care Procedures  ED Course and Medical Decision Making  Initial Impression and Ddx Patient is moaning and crying in the setting of COVID infection.  This does raise concern for more significant pathology however could be embellishment in the setting of anxiety.  Abdomen is soft and nontender, no meningismus, overall reassuring vital signs.  Awaiting labs, providing symptomatic management and will monitor closely.  Past medical/surgical history that increases complexity of ED encounter: Anxiety  Interpretation of Diagnostics I personally reviewed the EKG and my interpretation is as follows: Sinus rhythm  No significant blood count or electrolyte disturbance.,  Mild acidosis suspect dehydration  Patient Reassessment and Ultimate Disposition/Management     Patient with excellent response to fluids, Haldol, Toradol.  Symptoms are resolved, sleeping comfortably, wakes easily, would like to go home.  Appropriate for discharge.  Patient management required discussion with the following services or consulting groups:  None  Complexity of Problems Addressed Acute illness or injury that poses threat of life of bodily function  Additional Data Reviewed and Analyzed Further history obtained from: Further history from spouse/family member  Additional Factors Impacting ED Encounter Risk Prescriptions  Elmer Sow. Pilar Plate, MD [facility] Emergency Medicine [facility] [email or web address]  Final Clinical Impressions(s) / ED Diagnoses     ICD-10-CM   1. COVID-19  U07.1       ED Discharge Orders          Ordered     promethazine (PHENERGAN) 25 MG tablet  Every 6 hours PRN        04/23/23 0153             Discharge Instructions Discussed with and Provided to Patient:     Discharge Instructions      You were evaluated in the Emergency Department and after careful evaluation, we did not find any emergent condition requiring admission or further testing in the hospital.  Your exam/testing today is overall reassuring.  Symptoms likely explained by COVID-19.  Continue plenty of fluids and rest at home, Tylenol or Motrin for discomfort, can use the Phenergan as needed for nausea.  Please return to the Emergency Department if you experience any worsening of your condition.   Thank you for allowing [place] to be a  part of your care.       Sabas Sous, MD 04/23/23 [id number]

## 2023-04-23 MED ORDER — PROMETHAZINE HCL 25 MG PO TABS: 25.0000 mg | ORAL_TABLET | Freq: Four times a day (QID) | ORAL | 0 refills | Status: AC | PRN

## 2023-04-23 NOTE — Discharge Instructions (Signed)
 You were evaluated in the Emergency Department and after careful evaluation, we did not find any emergent condition requiring admission or further testing in the hospital.  Your exam/testing today is overall reassuring.  Symptoms likely explained by COVID-19.  Continue plenty of fluids and rest at home, Tylenol or Motrin for discomfort, can use the Phenergan as needed for nausea.  Please return to the Emergency Department if you experience any worsening of your condition.   Thank you for allowing [place] to be a part of your care.

## 2024-02-09 ENCOUNTER — Other Ambulatory Visit: Payer: Self-pay

## 2024-02-09 ENCOUNTER — Emergency Department (HOSPITAL_BASED_OUTPATIENT_CLINIC_OR_DEPARTMENT_OTHER)
Admission: EM | Admit: 2024-02-09 | Discharge: 2024-02-09 | Disposition: A | Discharge status: Home / Self Care | Attending: Emergency Medicine | Admitting: Emergency Medicine

## 2024-02-09 ENCOUNTER — Encounter (HOSPITAL_BASED_OUTPATIENT_CLINIC_OR_DEPARTMENT_OTHER): Payer: Self-pay

## 2024-02-09 ENCOUNTER — Emergency Department (HOSPITAL_BASED_OUTPATIENT_CLINIC_OR_DEPARTMENT_OTHER)

## 2024-02-09 DIAGNOSIS — K2900 Acute gastritis without bleeding: Secondary | ICD-10-CM | POA: Insufficient documentation

## 2024-02-09 DIAGNOSIS — J101 Influenza due to other identified influenza virus with other respiratory manifestations: Secondary | ICD-10-CM | POA: Diagnosis not present

## 2024-02-09 DIAGNOSIS — D72829 Elevated white blood cell count, unspecified: Secondary | ICD-10-CM | POA: Insufficient documentation

## 2024-02-09 DIAGNOSIS — D3502 Benign neoplasm of left adrenal gland: Secondary | ICD-10-CM | POA: Diagnosis not present

## 2024-02-09 DIAGNOSIS — M549 Dorsalgia, unspecified: Secondary | ICD-10-CM | POA: Insufficient documentation

## 2024-02-09 DIAGNOSIS — R079 Chest pain, unspecified: Secondary | ICD-10-CM

## 2024-02-09 DIAGNOSIS — R059 Cough, unspecified: Secondary | ICD-10-CM | POA: Diagnosis present

## 2024-02-09 DIAGNOSIS — R0602 Shortness of breath: Secondary | ICD-10-CM | POA: Insufficient documentation

## 2024-02-09 LAB — HEPATIC FUNCTION PANEL
ALT: 17 U/L (ref 0–44)
AST: 16 U/L (ref 15–41)
Albumin: 4.7 g/dL (ref 3.5–5.0)
Alkaline Phosphatase: 64 U/L (ref 38–126)
Bilirubin, Direct: 0.3 mg/dL — ABNORMAL HIGH (ref 0.0–0.2)
Indirect Bilirubin: 0.7 mg/dL (ref 0.3–0.9)
Total Bilirubin: 1 mg/dL (ref 0.0–1.2)
Total Protein: 7.5 g/dL (ref 6.5–8.1)

## 2024-02-09 LAB — TROPONIN T, HIGH SENSITIVITY
Troponin T High Sensitivity: <15 ng/L (ref 0–19)
Troponin T High Sensitivity: <15 ng/L (ref 0–19)

## 2024-02-09 LAB — CBC
HCT: 42.5 % (ref 36.0–46.0)
Hemoglobin: 15.1 g/dL — ABNORMAL HIGH (ref 12.0–15.0)
MCH: 30.8 pg (ref 26.0–34.0)
MCHC: 35.5 g/dL (ref 30.0–36.0)
MCV: 86.7 fL (ref 80.0–100.0)
Platelets: 241 K/uL (ref 150–400)
RBC: 4.9 MIL/uL (ref 3.87–5.11)
RDW: 12.2 % (ref 11.5–15.5)
WBC: 13 K/uL — ABNORMAL HIGH (ref 4.0–10.5)
nRBC: 0 % (ref 0.0–0.2)

## 2024-02-09 LAB — BASIC METABOLIC PANEL WITH GFR
Anion gap: 15 (ref 5–15)
BUN: 16 mg/dL (ref 6–20)
CO2: 19 mmol/L — ABNORMAL LOW (ref 22–32)
Calcium: 9.6 mg/dL (ref 8.9–10.3)
Chloride: 106 mmol/L (ref 98–111)
Creatinine, Ser: 0.69 mg/dL (ref 0.44–1.00)
GFR, Estimated: >60 mL/min
Glucose, Bld: 119 mg/dL — ABNORMAL HIGH (ref 70–99)
Potassium: 3.7 mmol/L (ref 3.5–5.1)
Sodium: 140 mmol/L (ref 135–145)

## 2024-02-09 LAB — RESP PANEL BY RT-PCR (RSV, FLU A&B, COVID)  RVPGX2
Influenza A by PCR: POSITIVE — AB
Influenza B by PCR: NEGATIVE
Resp Syncytial Virus by PCR: NEGATIVE
SARS Coronavirus 2 by RT PCR: NEGATIVE

## 2024-02-09 LAB — PRO BRAIN NATRIURETIC PEPTIDE: Pro Brain Natriuretic Peptide: 91.5 pg/mL

## 2024-02-09 LAB — HCG, SERUM, QUALITATIVE: Preg, Serum: NEGATIVE

## 2024-02-09 LAB — CK: Total CK: 37 U/L — ABNORMAL LOW (ref 38–234)

## 2024-02-09 LAB — LIPASE, BLOOD: Lipase: 13 U/L (ref 11–51)

## 2024-02-09 MED ORDER — OSELTAMIVIR PHOSPHATE 75 MG PO CAPS: 75.0000 mg | ORAL_CAPSULE | Freq: Two times a day (BID) | ORAL | 0 refills | Status: DC

## 2024-02-09 MED ORDER — KETOROLAC TROMETHAMINE 15 MG/ML IJ SOLN
15.0000 mg | Freq: Once | INTRAMUSCULAR | Status: AC
  Administered 2024-02-09: 15 mg via INTRAVENOUS
  Filled 2024-02-09: qty 1

## 2024-02-09 MED ORDER — IOHEXOL 350 MG/ML SOLN
100.0000 mL | Freq: Once | INTRAVENOUS | Status: AC | PRN
  Administered 2024-02-09: 100 mL via INTRAVENOUS

## 2024-02-09 MED ORDER — LACTATED RINGERS IV BOLUS
1000.0000 mL | Freq: Once | INTRAVENOUS | Status: AC
  Administered 2024-02-09: 1000 mL via INTRAVENOUS

## 2024-02-09 MED ORDER — OSELTAMIVIR PHOSPHATE 75 MG PO CAPS: 75.0000 mg | ORAL_CAPSULE | Freq: Two times a day (BID) | ORAL | 0 refills | Status: AC

## 2024-02-09 MED ORDER — SODIUM CHLORIDE 0.9 % IV BOLUS
1000.0000 mL | Freq: Once | INTRAVENOUS | Status: AC
  Administered 2024-02-09: 1000 mL via INTRAVENOUS

## 2024-02-09 MED ORDER — PANTOPRAZOLE SODIUM 20 MG PO TBEC: 20.0000 mg | DELAYED_RELEASE_TABLET | Freq: Every day | ORAL | 0 refills | Status: DC

## 2024-02-09 MED ORDER — ONDANSETRON HCL 4 MG/2ML IJ SOLN
4.0000 mg | Freq: Once | INTRAMUSCULAR | Status: AC
  Administered 2024-02-09: 4 mg via INTRAVENOUS
  Filled 2024-02-09: qty 2

## 2024-02-09 MED ORDER — LIDOCAINE VISCOUS HCL 2 % MT SOLN
15.0000 mL | Freq: Once | OROMUCOSAL | Status: AC
  Administered 2024-02-09: 15 mL via ORAL
  Filled 2024-02-09: qty 15

## 2024-02-09 MED ORDER — HYDROCODONE-ACETAMINOPHEN 5-325 MG PO TABS: 1.0000 | ORAL_TABLET | Freq: Four times a day (QID) | ORAL | 0 refills | Status: DC | PRN

## 2024-02-09 MED ORDER — MORPHINE SULFATE (PF) 4 MG/ML IV SOLN
4.0000 mg | Freq: Once | INTRAVENOUS | Status: AC
  Administered 2024-02-09: 4 mg via INTRAVENOUS
  Filled 2024-02-09: qty 1

## 2024-02-09 MED ORDER — FAMOTIDINE 20 MG PO TABS: 20.0000 mg | ORAL_TABLET | Freq: Two times a day (BID) | ORAL | 0 refills | Status: AC

## 2024-02-09 MED ORDER — FAMOTIDINE IN NACL 20-0.9 MG/50ML-% IV SOLN
20.0000 mg | Freq: Once | INTRAVENOUS | Status: AC
  Administered 2024-02-09: 20 mg via INTRAVENOUS
  Filled 2024-02-09: qty 50

## 2024-02-09 MED ORDER — HYDROMORPHONE HCL 1 MG/ML IJ SOLN
1.0000 mg | Freq: Once | INTRAMUSCULAR | Status: AC
  Administered 2024-02-09: 1 mg via INTRAVENOUS
  Filled 2024-02-09: qty 1

## 2024-02-09 MED ORDER — ALUM & MAG HYDROXIDE-SIMETH 200-200-20 MG/5ML PO SUSP
30.0000 mL | Freq: Once | ORAL | Status: AC
  Administered 2024-02-09: 30 mL via ORAL
  Filled 2024-02-09: qty 30

## 2024-02-09 MED ORDER — HYDROMORPHONE HCL 1 MG/ML IJ SOLN
0.5000 mg | Freq: Once | INTRAMUSCULAR | Status: AC
  Administered 2024-02-09: 0.5 mg via INTRAVENOUS
  Filled 2024-02-09: qty 1

## 2024-02-09 MED ORDER — FAMOTIDINE 20 MG PO TABS: 20.0000 mg | ORAL_TABLET | Freq: Two times a day (BID) | ORAL | 0 refills | Status: DC

## 2024-02-09 MED ORDER — PANTOPRAZOLE SODIUM 20 MG PO TBEC: 20.0000 mg | DELAYED_RELEASE_TABLET | Freq: Every day | ORAL | 0 refills | Status: AC

## 2024-02-09 MED ORDER — LORAZEPAM 1 MG PO TABS
1.0000 mg | ORAL_TABLET | Freq: Once | ORAL | Status: AC
  Administered 2024-02-09: 1 mg via ORAL
  Filled 2024-02-09: qty 1

## 2024-02-09 MED ORDER — HYDROCODONE-ACETAMINOPHEN 5-325 MG PO TABS: 1.0000 | ORAL_TABLET | Freq: Four times a day (QID) | ORAL | 0 refills | Status: AC | PRN

## 2024-02-09 NOTE — Discharge Instructions (Addendum)
 You tested positive for influenza.  We discussed the risks and benefits of Tamiflu  and you have elected to proceed with taking the medication.  He also presented with epigastric and left upper quadrant pain could be consistent with gastritis or GERD.  Your symptoms improved following IV medications in the emergency department.  Your CT imaging of the chest and abdomen was overall reassuring as well as your laboratory evaluation.  Treat symptomatically with Tylenol , given the concern for gastritis and GERD, will hold off on NSAIDs for further management and opiates have been prescribed for short course for breakthrough pain.  Your CT scan showed no acute abnormalities, did show an incidental finding of a left adrenal adenoma which is likely benign and stable from prior exams. IMPRESSION:  1. No evidence of pulmonary embolism or other acute intrathoracic  process.  2. Stable left adrenal mass, likely consistent with an adrenal  adenoma.

## 2024-02-09 NOTE — ED Notes (Signed)
 Lab notified of lipase, CK add-ons.

## 2024-02-09 NOTE — ED Notes (Signed)
Patient transported to CT and back without event.

## 2024-02-09 NOTE — ED Triage Notes (Addendum)
 C/o cough, nausea, body aches, chest pain, shortness of breath since this morning.  Hyperventilating in triage, anxious, crying. Provider at bedside

## 2024-02-09 NOTE — ED Notes (Signed)
 ED Provider at bedside.

## 2024-02-09 NOTE — ED Provider Notes (Signed)
 " [facility] Provider Note   CSN: [id number] Arrival date & time: 02/09/24  9158     Patient presents with: Chest Pain and URI   Rachel Calhoun is a 29 y.o. female.    Chest Pain Associated symptoms: cough and shortness of breath   URI Presenting symptoms: cough      29 year old female with medical history significant for anxiety, gallstones s/p cholecystectomy, sciatica presenting to the emergency department with a chief complaint of shortness of breath, chest pain.  The patient states that she woke up earlier this morning overnight with a cough, generalized bodyaches.  She states I hurt all over.  When asked to specify, the patient's mother states that she had been experiencing a chest pressure sensation as if something was sitting on her chest.  She has had a nonproductive cough.  She is visibly anxious on initial arrival.  She denies ripping tearing pain, does endorse pain in her back but also is endorsing whole body pain.  Prior to Admission medications  Medication Sig Start Date End Date Taking? Authorizing Provider  famotidine  (PEPCID ) 20 MG tablet Take 1 tablet (20 mg total) by mouth 2 (two) times daily. 02/09/24  Yes Jerrol Agent, MD  HYDROcodone -acetaminophen  (NORCO/VICODIN) 5-325 MG tablet Take 1 tablet by mouth every 6 (six) hours as needed. 02/09/24  Yes Jerrol Agent, MD  oseltamivir  (TAMIFLU ) 75 MG capsule Take 1 capsule (75 mg total) by mouth every 12 (twelve) hours. 02/09/24  Yes Jerrol Agent, MD  pantoprazole  (PROTONIX ) 20 MG tablet Take 1 tablet (20 mg total) by mouth daily. 02/09/24 03/10/24 Yes Jerrol Agent, MD  aspirin-acetaminophen -caffeine (EXCEDRIN MIGRAINE) 250-250-65 MG tablet Take 2 tablets by mouth daily as needed for headache.    [provider]  FLUoxetine  (PROZAC ) 20 MG capsule Take 1 capsule (20 mg total) by mouth daily. Patient not taking: Reported on 01/28/2022 02/22/19   Wonda Clarita BRAVO, NP   promethazine  (PHENERGAN ) 25 MG tablet Take 1 tablet (25 mg total) by mouth every 6 (six) hours as needed for nausea or vomiting. 04/23/23   Theadore Ozell HERO, MD    Allergies: Belladonna alkaloids and Clindamycin/lincomycin    Review of Systems  Respiratory:  Positive for cough and shortness of breath.   Cardiovascular:  Positive for chest pain.  All other systems reviewed and are negative.   Updated Vital Signs BP (!) 95/59   Pulse 81   Temp 98.5 F (36.9 C) (Oral)   Resp 20   Ht 5' 2 (1.575 m)   Wt 84 kg   SpO2 93%   BMI 33.87 kg/m   Physical Exam Vitals and nursing note reviewed.  Constitutional:      General: She is in acute distress.     Appearance: She is well-developed.     Comments: Acutely agitated, anxious and tearful  HENT:     Head: Normocephalic and atraumatic.  Eyes:     Conjunctiva/sclera: Conjunctivae normal.  Cardiovascular:     Rate and Rhythm: Regular rhythm. Tachycardia present.     Pulses: Normal pulses.  Pulmonary:     Effort: Pulmonary effort is normal. Tachypnea present. No respiratory distress.     Breath sounds: Rhonchi present.  Abdominal:     Palpations: Abdomen is soft.  Musculoskeletal:        General: No swelling.     Cervical back: Neck supple.  Skin:    General: Skin is warm and dry.  Capillary Refill: Capillary refill takes less than 2 seconds.  Neurological:     Mental Status: She is alert.  Psychiatric:        Mood and Affect: Mood is anxious. Affect is tearful.     (all labs ordered are listed, but only abnormal results are displayed) Labs Reviewed  RESP PANEL BY RT-PCR (RSV, FLU A&B, COVID)  RVPGX2 - Abnormal; Notable for the following components:      Result Value   Influenza A by PCR POSITIVE (*)    All other components within normal limits  BASIC METABOLIC PANEL WITH GFR - Abnormal; Notable for the following components:   CO2 19 (*)    Glucose, Bld 119 (*)    All other components within normal limits  CBC -  Abnormal; Notable for the following components:   WBC 13.0 (*)    Hemoglobin 15.1 (*)    All other components within normal limits  HEPATIC FUNCTION PANEL - Abnormal; Notable for the following components:   Bilirubin, Direct 0.3 (*)    All other components within normal limits  CK - Abnormal; Notable for the following components:   Total CK 37 (*)    All other components within normal limits  PRO BRAIN NATRIURETIC PEPTIDE  HCG, SERUM, QUALITATIVE  LIPASE, BLOOD  TROPONIN T, HIGH SENSITIVITY  TROPONIN T, HIGH SENSITIVITY    EKG: EKG Interpretation Date/Time:  Tuesday February 09 2024 09:02:04 EST Ventricular Rate:  112 PR Interval:  137 QRS Duration:  83 QT Interval:  332 QTC Calculation: 454 R Axis:   58  Text Interpretation: Sinus tachycardia Confirmed by Jerrol Agent ([id number]) on 02/09/2024 9:20:58 AM  Radiology: CT ABDOMEN PELVIS W CONTRAST Result Date: 02/09/2024 CLINICAL DATA:  Left upper quadrant pain with cough, nausea, body aches, chest pain and shortness of breath. EXAM: CT ABDOMEN AND PELVIS WITH CONTRAST TECHNIQUE: Multidetector CT imaging of the abdomen and pelvis was performed using the standard protocol following bolus administration of intravenous contrast. RADIATION DOSE REDUCTION: This exam was performed according to the departmental dose-optimization program which includes automated exposure control, adjustment of the mA and/or kV according to patient size and/or use of iterative reconstruction technique. CONTRAST:  OMNIPAQUE  IOHEXOL  350 MG/ML SOLN COMPARISON:  January 28, 2022 FINDINGS: Lower chest: No acute abnormality. Hepatobiliary: There is diffuse fatty infiltration of the liver parenchyma. No focal liver abnormality is seen. Status post cholecystectomy. No biliary dilatation. Pancreas: Unremarkable. No pancreatic ductal dilatation or surrounding inflammatory changes. Spleen: Normal in size without focal abnormality. Adrenals/Urinary Tract: There is a stable  1.3 cm low-attenuation (approximately 40 Hounsfield units) left adrenal mass. The right adrenal gland is unremarkable. Kidneys are normal, without renal calculi, focal lesion, or hydronephrosis. Bladder is unremarkable. Stomach/Bowel: Stomach is within normal limits. Appendix appears normal. No evidence of bowel wall thickening, distention, or inflammatory changes. Noninflamed diverticula are seen within the mid and distal descending colon. Vascular/Lymphatic: No significant vascular findings are present. No enlarged abdominal or pelvic lymph nodes. Reproductive: Uterus and bilateral adnexa are unremarkable. Other: No abdominal wall hernia or abnormality. No abdominopelvic ascites. Musculoskeletal: No acute or significant osseous findings. IMPRESSION: 1. Hepatic steatosis. 2. Evidence of prior cholecystectomy. 3. Colonic diverticulosis. 4. 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 Electronically Signed   By: Suzen Dials M.D.   On: 02/09/2024 11:50   CT Angio Chest PE W and/or Wo Contrast Result Date: 02/09/2024 CLINICAL DATA:  Cough with nausea, body aches and chest pain and shortness  of breath. EXAM: CT ANGIOGRAPHY CHEST WITH CONTRAST TECHNIQUE: Multidetector CT imaging of the chest was performed using the standard protocol during bolus administration of intravenous contrast. Multiplanar CT image reconstructions and MIPs were obtained to evaluate the vascular anatomy. RADIATION DOSE REDUCTION: This exam was performed according to the departmental dose-optimization program which includes automated exposure control, adjustment of the mA and/or kV according to patient size and/or use of iterative reconstruction technique. CONTRAST:  OMNIPAQUE  IOHEXOL  350 MG/ML SOLN COMPARISON:  January 28, 2022 FINDINGS: Cardiovascular: Satisfactory opacification of the pulmonary arteries to the segmental level. No evidence of pulmonary embolism. Normal heart size. No pericardial effusion.  Mediastinum/Nodes: No enlarged mediastinal, hilar, or axillary lymph nodes. Thyroid gland, trachea, and esophagus demonstrate no significant findings. Lungs/Pleura: Lungs are clear. No pleural effusion or pneumothorax. Upper Abdomen: Surgical clips are seen within the gallbladder fossa. A stable 1.4 cm low-attenuation (approximately 40.21 Hounsfield units) left adrenal mass is noted. Musculoskeletal: No chest wall abnormality. No acute or significant osseous findings. Review of the MIP images confirms the above findings. IMPRESSION: 1. No evidence of pulmonary embolism or other acute intrathoracic process. 2. 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 Electronically Signed   By: Suzen Dials M.D.   On: 02/09/2024 11:41     Procedures   Medications Ordered in the ED  LORazepam  (ATIVAN ) tablet 1 mg (1 mg Oral Given 02/09/24 0851)  morphine  (PF) 4 MG/ML injection 4 mg (4 mg Intravenous Given 02/09/24 0908)  ondansetron  (ZOFRAN ) injection 4 mg (4 mg Intravenous Given 02/09/24 0940)  sodium chloride  0.9 % bolus 1,000 mL (0 mLs Intravenous Stopped 02/09/24 1114)  HYDROmorphone  (DILAUDID ) injection 0.5 mg (0.5 mg Intravenous Given 02/09/24 0956)  ketorolac  (TORADOL ) 15 MG/ML injection 15 mg (15 mg Intravenous Given 02/09/24 1019)  iohexol  (OMNIPAQUE ) 350 MG/ML injection 100 mL (100 mLs Intravenous Contrast Given 02/09/24 1043)  famotidine  (PEPCID ) IVPB 20 mg premix (0 mg Intravenous Stopped 02/09/24 1051)  alum & mag hydroxide-simeth (MAALOX/MYLANTA) 200-200-20 MG/5ML suspension 30 mL (30 mLs Oral Given 02/09/24 1211)    And  lidocaine  (XYLOCAINE ) 2 % viscous mouth solution 15 mL (15 mLs Oral Given 02/09/24 1212)  HYDROmorphone  (DILAUDID ) injection 1 mg (1 mg Intravenous Given 02/09/24 1207)  lactated ringers  bolus 1,000 mL (1,000 mLs Intravenous New Bag/Given 02/09/24 1211)    Clinical Course as of 02/09/24 1307  Tue Feb 09, 2024  1003 Influenza A By PCR(!): POSITIVE [JL]    Clinical  Course User Index [JL] Jerrol Agent, MD                                 Medical Decision Making Amount and/or Complexity of Data Reviewed Labs: ordered. Decision-making details documented in ED Course. Radiology: ordered.  Risk OTC drugs. Prescription drug management.    29 year old female with medical history significant for anxiety, gallstones s/p cholecystectomy, sciatica presenting to the emergency department with a chief complaint of shortness of breath, chest pain.  The patient states that she woke up earlier this morning overnight with a cough, generalized bodyaches.  She states I hurt all over.  When asked to specify, the patient's mother states that she had been experiencing a chest pressure sensation as if something was sitting on her chest.  She has had a nonproductive cough.  She is visibly anxious on initial arrival.  She denies ripping tearing pain, does endorse pain in her back but also  is endorsing whole body pain.  On arrival, the patient was afebrile, tachycardic heart rate 125, tachypneic RR 26, BP 131/98, saturating 97% on room air.  On exam the patient was visibly exam she is not appear to be experiencing a panic attack, hyperventilating endorsing whole body pain but also endorsing chest pressure.  Ativan  administered in triage with subsequent significant improvement in patient's symptoms.  Patient endorsing whole body pain in the setting of URI symptoms.  He said cough, nausea, body aches.  Also considered pulmonary embolism, less likely ACS, less likely pneumothorax, pneumonia, less likely Boerhaave syndrome, peptic ulcer disease.  Pain is not ripping or tearing, low concern for aortic dissection.  Symmetric pulses bilaterally.  Patient is status post cholecystectomy.  EKG: Sinus tachycardia, no acute ischemic changes, ventricular rate 112  Labs: CBC with evidence of hemoconcentration with a leukocytosis 13, hemoglobin of 15, BMP with a non-anion gap acidosis with a  bicarbonate of 19, anion gap of 15, creatinine normal, [place] function panel normal, lipase normal, CK unremarkable, hCG negative, BNP and cardiac troponins x2 normal.  PCR testing was positive for influenza A.  Pt reassessed, appears improved. Mother noted pain resolved but seems to be coming back. She says Morphine  does work for her, and would like Dilaudid .  I explained to the patient and her mother that Dilaudid  is a very strong opiate pain medication that I usually reserve for severe breakthrough pain in the setting of severe injury.  Patient appears to be resting comfortably at this time.  Patient subsequent had returned of pain and was administered additional medications to include Toradol  and Dilaudid .  Administered IV Pepcid .  CTA PE & CT Abd Pel: IMPRESSION:  1. No evidence of pulmonary embolism or other acute intrathoracic  process.  2. Stable left adrenal mass, likely consistent with an adrenal  adenoma.   On repeat assessment, the patient was feeling symptomatically improved.  She was tolerating oral intake.  She was fluid resuscitated with 2 L of IV fluids.  She tolerated GI cocktail.  Suspect component of dehydration given her hemoconcentration on her CBC.  Workup reassuring with no evidence of pneumonia, pneumothorax, pericardial effusion, pulmonary embolism, Boerhaave syndrome, intra-abdominal abnormality such as pancreatitis, bowel obstruction, no evidence of aortic dissection and low suspicion for the same.  Patient has symmetric pulses.  Feeling symptomatically improved.  Discussed risks and benefits of Tamiflu  and patient elected to proceed with prescription for Tamiflu  after discussion of risks and benefits.  Will discharge on Tylenol , opiates for breakthrough pain, advised avoidance of NSAIDs in the setting of possibility of gastritis and GERD.  Will discharge on Pepcid  and Protonix  and have the patient follow-up with her primary care provider.      Final diagnoses:   Influenza A  Chest pain, unspecified type  Acute gastritis, presence of bleeding unspecified, unspecified gastritis type  Adenoma of left adrenal gland    ED Discharge Orders          Ordered    HYDROcodone -acetaminophen  (NORCO/VICODIN) 5-325 MG tablet  Every 6 hours PRN        02/09/24 1241    oseltamivir  (TAMIFLU ) 75 MG capsule  Every 12 hours        02/09/24 1241    famotidine  (PEPCID ) 20 MG tablet  2 times daily        02/09/24 1241    pantoprazole  (PROTONIX ) 20 MG tablet  Daily        02/09/24 1241  Jerrol Agent, MD 02/09/24 1307  "

## 2024-02-09 NOTE — ED Notes (Signed)
 RT assessed in triage. Stated she was SOB and having pain. BBS slight rhonchi but good air movement. SAT 98%. Patient tearful and yelling, but no distress noted. MD at bedside
